# Patient Record
Sex: Female | Born: 1960 | Race: White | Hispanic: No | Smoking: Never smoker
Health system: Southern US, Community
[De-identification: ages and names within clinical notes are randomized; demographics above are authoritative.]

## PROBLEM LIST (undated history)

## (undated) DIAGNOSIS — H269 Unspecified cataract: Secondary | ICD-10-CM

## (undated) DIAGNOSIS — M719 Bursopathy, unspecified: Secondary | ICD-10-CM

## (undated) DIAGNOSIS — I10 Essential (primary) hypertension: Secondary | ICD-10-CM

## (undated) DIAGNOSIS — T7840XA Allergy, unspecified, initial encounter: Secondary | ICD-10-CM

## (undated) DIAGNOSIS — E7800 Pure hypercholesterolemia, unspecified: Secondary | ICD-10-CM

## (undated) HISTORY — PX: EXPLORATORY LAPAROTOMY: SUR591

## (undated) HISTORY — PX: EYE SURGERY: SHX253

## (undated) HISTORY — PX: OTHER SURGICAL HISTORY: SHX169

## (undated) HISTORY — PX: CARDIAC CATHETERIZATION: SHX172

## (undated) HISTORY — PX: ANAL FISTULOTOMY: SHX6423

## (undated) HISTORY — PX: CATARACT EXTRACTION: SUR2

## (undated) HISTORY — DX: Bursopathy, unspecified: M71.9

## (undated) HISTORY — DX: Unspecified cataract: H26.9

## (undated) HISTORY — DX: Allergy, unspecified, initial encounter: T78.40XA

---

## 2010-01-06 ENCOUNTER — Emergency Department (HOSPITAL_COMMUNITY): Admission: EM | Admit: 2010-01-06 | Discharge: 2010-01-06 | Payer: Self-pay | Admitting: Emergency Medicine

## 2010-03-17 ENCOUNTER — Encounter: Payer: Self-pay | Admitting: Emergency Medicine

## 2010-05-08 LAB — POCT URINALYSIS DIPSTICK
Glucose, UA: NEGATIVE mg/dL
Nitrite: NEGATIVE
Protein, ur: NEGATIVE mg/dL
Urobilinogen, UA: 0.2 mg/dL (ref 0.0–1.0)
pH: 7 (ref 5.0–8.0)

## 2011-11-29 ENCOUNTER — Emergency Department (HOSPITAL_COMMUNITY)
Admission: EM | Admit: 2011-11-29 | Discharge: 2011-11-29 | Disposition: A | Discharge status: Home / Self Care | Payer: BC Managed Care – PPO | Source: Home / Self Care | Attending: Emergency Medicine | Admitting: Emergency Medicine

## 2011-11-29 ENCOUNTER — Encounter (HOSPITAL_COMMUNITY): Payer: Self-pay | Admitting: Emergency Medicine

## 2011-11-29 DIAGNOSIS — H60399 Other infective otitis externa, unspecified ear: Secondary | ICD-10-CM

## 2011-11-29 DIAGNOSIS — H6092 Unspecified otitis externa, left ear: Secondary | ICD-10-CM

## 2011-11-29 HISTORY — DX: Essential (primary) hypertension: I10

## 2011-11-29 MED ORDER — ACETIC ACID 2 % OT SOLN: 4.0000 [drp] | Freq: Three times a day (TID) | OTIC | Status: AC

## 2011-11-29 MED ORDER — ACETAMINOPHEN-CODEINE #3 300-30 MG PO TABS: 1.0000 | ORAL_TABLET | Freq: Four times a day (QID) | ORAL | Status: DC | PRN

## 2011-11-29 NOTE — ED Provider Notes (Signed)
History     CSN: [id number]  Arrival date & time 11/29/11  1010   First MD Initiated Contact with Patient 11/29/11 1033      Chief Complaint  Patient presents with  . Otalgia    (Consider location/radiation/quality/duration/timing/severity/associated sxs/prior treatment) HPI Comments: Patient presents urgent care complaining of moderate to severe left ear pain and decreased hearing. (Patient wears hearing aids were both EARS), she describes that she sees any and he Dr. at [facility] where she resides. She describes that she gets frequent ear infections by Candida and she gets allergy shots for this purpose.  Patient describes that Wednesday she started (woke up), with severe pain and discomfort, or from her left ear. She describes that the night prior to say night she did feel some throbbing pain but was mild. Patient describes it Tuesday morning she saw her primary care doctor and got a flu shot and is wondering if this has something to do. She denies prior her ear hurting any sinus congestion, but does admit that she sneezes occasionally and has a somewhat mild stuffy nose. She does take loratadine for her allergies.  [facility] where she was told she had in the ear infection, was prescribed ofloxicin ear drops and azithromycin. She started with the ear drops as instructed but felt that she is no better and in fact this morning is worse she is tender and she is perceiving that she is hearing even less. She denies any fevers, headache nausea or vomiting.  The history is provided by the patient.    Past Medical History  Diagnosis Date  . Hypertension     Past Surgical History  Procedure Date  . Cardiac surgery     No family history on file.  History  Substance Use Topics  . Smoking status: Never Smoker   . Smokeless tobacco: Not on file  . Alcohol Use: No    OB History    Grav Para Term Preterm Abortions TAB SAB Ect Mult Living           Review of Systems  Constitutional: Negative for fever, chills, activity change and appetite change.  HENT: Positive for hearing loss, ear pain, congestion and sneezing. Negative for rhinorrhea, neck pain, neck stiffness, postnasal drip and tinnitus.   Skin: Negative for rash and wound.  Neurological: Negative for dizziness and headaches.    Allergies  Aspirin; Ceftin; Cortizone-10; Penicillins; Prednisone; and Sulfur  Home Medications   Current Outpatient Rx  Name Route Sig Dispense Refill  . DILTIAZEM HCL 120 MG PO TABS Oral Take 120 mg by mouth 4 (four) times daily.    . OMEGA-3 FATTY ACIDS 1000 MG PO CAPS Oral Take 2 g by mouth daily.    . FUROSEMIDE 10 MG/ML PO SOLN Oral Take by mouth daily.    Marland Kitchen LORATADINE 10 MG PO TABS Oral Take 10 mg by mouth daily.    Marland Kitchen PRAVASTATIN SODIUM 80 MG PO TABS Oral Take 80 mg by mouth daily.    . ACETAMINOPHEN-CODEINE #3 300-30 MG PO TABS Oral Take 1-2 tablets by mouth every 6 (six) hours as needed for pain. 15 tablet 0  . ACETIC ACID 2 % OT SOLN Left Ear Place 4 drops into the left ear 3 (three) times daily. 15 mL 0    BP 153/86  Pulse 92  Temp 98.6 F (37 C) (Oral)  Resp 20  SpO2 97%  Physical Exam  Nursing note and vitals reviewed.  Constitutional: Vital signs are normal. She appears well-developed and well-nourished.  Non-toxic appearance. She does not have a sickly appearance. She does not appear ill. No distress.  HENT:  Head: Normocephalic.  Right Ear: Tympanic membrane and ear canal normal.  Left Ear: There is drainage and tenderness. Decreased hearing is noted.  Ears:  Eyes: Conjunctivae normal are normal. Right eye exhibits no discharge. Left eye exhibits no discharge.  Neck: Neck supple. No JVD present.  Skin: No erythema.    ED Course  Irrigation Performed by: Shantese Raven Authorized by: Jimmie Molly Consent: Verbal consent obtained. Consent given by: patient Patient understanding: patient states understanding of  the procedure being performed Patient identity confirmed: verbally with patient Local anesthesia used: no Patient sedated: no Comments: Left ear irrigation- ear wick placement   (including critical care time)  Labs Reviewed - No data to display No results found.   1. Otitis externa of left ear       MDM  Postirradiation was able to visualize ear canal much better, patient with marked erythema and a exudate with marked tenderness with an ear traction and movement. Visualize the upper portion of her eardrum with no obvious retractions bulging or perforations. Have obtained a culture, have recommended patient to see her ENT Dr. early next week and have modified her previous treatment ascitic acid otic solution.        Jimmie Molly, MD 11/29/11 [phone]

## 2011-11-29 NOTE — ED Notes (Signed)
Pt c/o left ear pain x3 days... Sx include: chills, fevers, diarrhea.... Denies: vomiting, nausea, dizzy.... [facility] and given antibiotics

## 2011-12-02 LAB — EAR CULTURE

## 2011-12-11 ENCOUNTER — Telehealth (HOSPITAL_COMMUNITY): Payer: Self-pay | Admitting: *Deleted

## 2011-12-11 NOTE — ED Notes (Signed)
Ear culture left ear: Few MRSA.  Pt. had Azithromycin and Ofloxacin ear drops from her PCP.  Lab shown to Dr. Artis Flock 10/10 and he wrote to call to check on status.  If needed Doxycycline 100 mg. #20 1 BID.  I called pt. and she said she followed up with her ENT and it is cleared up now.  She said he changed her medication but does not remember what it was.  I told her to let her ENT know about the MRSA on the culture and she said she would.  Pt. given MRSA instructions. Suzanne Donaldson 12/11/2011

## 2011-12-16 ENCOUNTER — Telehealth (HOSPITAL_COMMUNITY): Payer: Self-pay | Admitting: *Deleted

## 2011-12-16 NOTE — ED Notes (Signed)
[facility] in [place] called and said the pt. had called them about having MRSA.  She asked what we had treated her with? I explained that her PCP already had her on Azithromycin and Ofloxacin ear drops.  We did not change anything at that time.  When the culture came back the doctor was going to change her to Doxycycline if not improved. I called the pt. and she said the infection was cleared up. The pt. said she had followed up at the ENT and they changed her antibiotic. She said they gave her some other meds but did not change the antibiotic. She asked what antibiotics was it sensitive and I read them to her. Vassie Moselle 12/16/2011

## 2016-04-13 ENCOUNTER — Ambulatory Visit (HOSPITAL_COMMUNITY)
Admission: EM | Admit: 2016-04-13 | Discharge: 2016-04-13 | Disposition: A | Discharge status: Home / Self Care | Payer: BC Managed Care – PPO | Attending: Family Medicine | Admitting: Family Medicine

## 2016-04-13 ENCOUNTER — Encounter (HOSPITAL_COMMUNITY): Payer: Self-pay | Admitting: *Deleted

## 2016-04-13 DIAGNOSIS — L02511 Cutaneous abscess of right hand: Secondary | ICD-10-CM

## 2016-04-13 MED ORDER — DOXYCYCLINE HYCLATE 100 MG PO CAPS: 100.0000 mg | ORAL_CAPSULE | Freq: Two times a day (BID) | ORAL | 0 refills | Status: DC

## 2016-04-13 NOTE — ED Provider Notes (Signed)
CSN: [id number]     Arrival date & time 04/13/16  1243 History   None    Chief Complaint  Patient presents with  . Hand Problem   (Consider location/radiation/quality/duration/timing/severity/associated sxs/prior Treatment) Patient thinks she was bitten by an ant on her right pinky and now it has sore and swelling middle knuckle.   The history is provided by the patient.  Abscess  Location:  Finger Finger abscess location:  R little finger Size:  0.5 cm Abscess quality: fluctuance, painful and redness   Pain details:    Severity:  No pain   Duration:  2 days   Timing:  Constant Chronicity:  New Relieved by:  Nothing   Past Medical History:  Diagnosis Date  . Hypertension    Past Surgical History:  Procedure Laterality Date  . CARDIAC SURGERY     History reviewed. No pertinent family history. Social History  Substance Use Topics  . Smoking status: Never Smoker  . Smokeless tobacco: Not on file  . Alcohol use No   OB History    No data available     Review of Systems  Constitutional: Negative.   HENT: Negative.   Eyes: Negative.   Respiratory: Negative.   Cardiovascular: Negative.   Endocrine: Negative.   Genitourinary: Negative.   Skin: Positive for wound.  Hematological: Negative.   Psychiatric/Behavioral: Negative.     Allergies  Aspirin; Ceftin [cefuroxime axetil]; Cortizone-10 [hydrocortisone]; Penicillins; Prednisone; and Sulfur  Home Medications   Prior to Admission medications   Medication Sig Start Date End Date Taking? Authorizing Provider  nebivolol (BYSTOLIC) 10 MG tablet Take 10 mg by mouth daily.   Yes Historical Provider, MD  acetaminophen-codeine (TYLENOL #3) 300-30 MG per tablet Take 1-2 tablets by mouth every 6 (six) hours as needed for pain. 11/29/11   Jimmie MollyPaolo Coll, MD  diltiazem (CARDIZEM) 120 MG tablet Take 120 mg by mouth 4 (four) times daily.    Historical Provider, MD  doxycycline (VIBRAMYCIN) 100 MG capsule Take 1 capsule (100 mg  total) by mouth 2 (two) times daily. 04/13/16   Deatra CanterWilliam J Xavion Muscat, FNP  fish oil-omega-3 fatty acids 1000 MG capsule Take 2 g by mouth daily.    Historical Provider, MD  furosemide (LASIX) 10 MG/ML solution Take by mouth daily.    Historical Provider, MD  loratadine (CLARITIN) 10 MG tablet Take 10 mg by mouth daily.    Historical Provider, MD  metaxalone (SKELAXIN) 800 MG tablet Take 800 mg by mouth 3 (three) times daily.    Historical Provider, MD  pravastatin (PRAVACHOL) 80 MG tablet Take 80 mg by mouth daily.    Historical Provider, MD   Meds Ordered and Administered this Visit  Medications - No data to display  BP 132/75 (BP Location: Right Arm)   Pulse 88   Temp 99.3 F (37.4 C) (Oral)   Resp 18   SpO2 100%  No data found.   Physical Exam  Constitutional: She appears well-developed and well-nourished.  HENT:  Head: Normocephalic.  Right Ear: External ear normal.  Left Ear: External ear normal.  Mouth/Throat: Oropharynx is clear and moist.  Eyes: Conjunctivae and EOM are normal. Pupils are equal, round, and reactive to light.  Neck: Normal range of motion. Neck supple.  Cardiovascular: Normal rate, regular rhythm and normal heart sounds.   Pulmonary/Chest: Effort normal and breath sounds normal.  Skin: Skin is warm and dry.  Cyst right pinky finger with erythema and purulence w/o drainage.  Positive fluctuance.  Nursing note and vitals reviewed.   Urgent Care Course     .Marland KitchenIncision and Drainage Date/Time: 04/13/2016 2:47 PM Performed by: Deatra Canter Authorized by: Bradd Canary D   Consent:    Consent obtained:  Verbal   Consent given by:  Patient   Risks discussed:  Bleeding and infection   Alternatives discussed:  No treatment Location:    Type:  Cyst   Size:  0.5cm   Location:  Upper extremity   Upper extremity location:  Finger   Finger location:  R small finger Pre-procedure details:    Skin preparation:  Betadine Anesthesia (see MAR for exact  dosages):    Anesthesia method:  None Procedure type:    Complexity:  Simple Procedure details:    Needle aspiration: yes     Needle size:  18 G   Incision types:  Stab incision   Incision depth:  Dermal   Scalpel blade:  11   Drainage:  Purulent and bloody   Drainage amount:  Scant   (including critical care time)  Labs Review Labs Reviewed - No data to display  Imaging Review No results found.   Visual Acuity Review  Right Eye Distance:   Left Eye Distance:   Bilateral Distance:    Right Eye Near:   Left Eye Near:    Bilateral Near:         MDM   1. Abscess of finger, right    Doxycycline 100mg  one po bid x 10 days #20 Incision and Drainage      Deatra Canter, FNP 04/13/16 1449

## 2016-04-13 NOTE — ED Triage Notes (Signed)
She   Was  Cleaning    2  Days  Ago   Poss  inj     Woke   Up   With  Pain   Pain  Redness   And   Swelling           Getting    Worse

## 2016-04-28 ENCOUNTER — Encounter (HOSPITAL_COMMUNITY): Payer: Self-pay | Admitting: Emergency Medicine

## 2016-04-28 ENCOUNTER — Ambulatory Visit (HOSPITAL_COMMUNITY)
Admission: EM | Admit: 2016-04-28 | Discharge: 2016-04-28 | Disposition: A | Discharge status: Home / Self Care | Payer: BC Managed Care – PPO | Attending: Family Medicine | Admitting: Family Medicine

## 2016-04-28 ENCOUNTER — Ambulatory Visit (INDEPENDENT_AMBULATORY_CARE_PROVIDER_SITE_OTHER): Payer: BC Managed Care – PPO

## 2016-04-28 DIAGNOSIS — S2241XA Multiple fractures of ribs, right side, initial encounter for closed fracture: Secondary | ICD-10-CM

## 2016-04-28 MED ORDER — HYDROCODONE-ACETAMINOPHEN 5-325 MG PO TABS: 1.0000 | ORAL_TABLET | Freq: Four times a day (QID) | ORAL | 0 refills | Status: DC | PRN

## 2016-04-28 NOTE — ED Provider Notes (Signed)
[facility]    CSN: [id number] Arrival date & time: 04/28/16  1501     History   Chief Complaint Chief Complaint  Patient presents with  . Fall    HPI Suzanne Donaldson is a 56 y.o. female.   This a 56 year old woman who was visiting her grandson at his elementary school when she fell 2 weeks ago landing on her right side. She did not experience pain at the time of the fall but the next day she was having fairly significant pain in the right mid thorax. The pain is gradually subsided somewhat over the past 2 weeks but she still has ongoing right anterolateral chest tenderness and pain when she takes a deep breath.  Patient's had no fever or shortness of breath      Past Medical History:  Diagnosis Date  . Hypertension     There are no active problems to display for this patient.   Past Surgical History:  Procedure Laterality Date  . CARDIAC SURGERY      OB History    No data available       Home Medications    Prior to Admission medications   Medication Sig Start Date End Date Taking? Authorizing Provider  diltiazem (CARDIZEM) 120 MG tablet Take 120 mg by mouth 4 (four) times daily.    Historical Provider, MD  fish oil-omega-3 fatty acids 1000 MG capsule Take 2 g by mouth daily.    Historical Provider, MD  furosemide (LASIX) 10 MG/ML solution Take by mouth daily.    Historical Provider, MD  HYDROcodone-acetaminophen (NORCO) 5-325 MG tablet Take 1 tablet by mouth every 6 (six) hours as needed for moderate pain. 04/28/16   Elvina Sidle, MD  loratadine (CLARITIN) 10 MG tablet Take 10 mg by mouth daily.    Historical Provider, MD  metaxalone (SKELAXIN) 800 MG tablet Take 800 mg by mouth 3 (three) times daily.    Historical Provider, MD  nebivolol (BYSTOLIC) 10 MG tablet Take 10 mg by mouth daily.    Historical Provider, MD  pravastatin (PRAVACHOL) 80 MG tablet Take 80 mg by mouth daily.    Historical Provider, MD    Family History History reviewed. No  pertinent family history.  Social History Social History  Substance Use Topics  . Smoking status: Never Smoker  . Smokeless tobacco: Not on file  . Alcohol use No     Allergies   Aspirin; Ceftin [cefuroxime axetil]; Cortizone-10 [hydrocortisone]; Penicillins; Prednisone; and Sulfur   Review of Systems Review of Systems  Constitutional: Negative.   HENT: Negative.   Respiratory: Negative for cough and shortness of breath.   Cardiovascular: Positive for chest pain.  Neurological: Negative.      Physical Exam Triage Vital Signs ED Triage Vitals [04/28/16 1620]  Enc Vitals Group     BP 140/63     Pulse Rate 63     Resp 18     Temp 98.4 F (36.9 C)     Temp Source Oral     SpO2 97 %     Weight      Height      Head Circumference      Peak Flow      Pain Score 5     Pain Loc      Pain Edu?      Excl. in GC?    No data found.   Updated Vital Signs BP 140/63 (BP Location: Left Arm)   Pulse 63  Temp 98.4 F (36.9 C) (Oral)   Resp 18   SpO2 97%    Physical Exam  Constitutional: She is oriented to person, place, and time. She appears well-developed and well-nourished.  HENT:  Head: Normocephalic.  Right Ear: External ear normal.  Left Ear: External ear normal.  Mouth/Throat: Oropharynx is clear and moist.  Eyes: Conjunctivae and EOM are normal. Pupils are equal, round, and reactive to light.  Neck: Normal range of motion. Neck supple.  Cardiovascular: Normal rate, regular rhythm and normal heart sounds.   Pulmonary/Chest: Effort normal and breath sounds normal.  Musculoskeletal: Normal range of motion.  Neurological: She is alert and oriented to person, place, and time.  Skin: Skin is warm and dry.  Nursing note and vitals reviewed.    UC Treatments / Results  Labs (all labs ordered are listed, but only abnormal results are displayed) Labs Reviewed - No data to display  EKG  EKG Interpretation None       Radiology Dg Ribs Unilateral  W/chest Right  Result Date: 04/28/2016 CLINICAL DATA:  Status post fall two weeks ago. Hit right side chest on floor. Right-sided chest pain, subacute onset. Initial encounter. EXAM: RIGHT RIBS AND CHEST - 3+ VIEW COMPARISON:  Chest radiograph from 01/06/2010 FINDINGS: There is suspicion of minimally displaced fractures of the right lateral fifth through seventh ribs. The lungs are well-aerated. Mild left basilar atelectasis is noted. There is no evidence of pleural effusion or pneumothorax. A tiny calcified granuloma is again noted at the right midlung zone. The cardiomediastinal silhouette is within normal limits. No acute osseous abnormalities are seen. IMPRESSION: 1. Suspect minimally displaced fractures of the right lateral fifth through seventh ribs. 2. Mild left basilar atelectasis noted. Electronically Signed   By: Roanna RaiderJeffery  Chang M.D.   On: 04/28/2016 17:06    Procedures Procedures (including critical care time)  Medications Ordered in UC Medications - No data to display   Initial Impression / Assessment and Plan / UC Course  I have reviewed the triage vital signs and the nursing notes.  Pertinent labs & imaging results that were available during my care of the patient were reviewed by me and considered in my medical decision making (see chart for details).     Final Clinical Impressions(s) / UC Diagnoses   Final diagnoses:  Closed fracture of multiple ribs of right side, initial encounter    New Prescriptions New Prescriptions   HYDROCODONE-ACETAMINOPHEN (NORCO) 5-325 MG TABLET    Take 1 tablet by mouth every 6 (six) hours as needed for moderate pain.     Elvina SidleKurt Gem Conkle, MD 04/28/16 1719

## 2016-04-28 NOTE — ED Triage Notes (Signed)
[facility] a [facility] pain to the right side of her chest secondary to a fall 2 weeks ago.

## 2016-09-08 ENCOUNTER — Ambulatory Visit (HOSPITAL_COMMUNITY)
Admission: EM | Admit: 2016-09-08 | Discharge: 2016-09-08 | Disposition: A | Discharge status: Home / Self Care | Payer: BC Managed Care – PPO | Attending: Internal Medicine | Admitting: Internal Medicine

## 2016-09-08 ENCOUNTER — Ambulatory Visit (INDEPENDENT_AMBULATORY_CARE_PROVIDER_SITE_OTHER): Payer: BC Managed Care – PPO

## 2016-09-08 ENCOUNTER — Encounter (HOSPITAL_COMMUNITY): Payer: Self-pay | Admitting: Emergency Medicine

## 2016-09-08 DIAGNOSIS — M79671 Pain in right foot: Secondary | ICD-10-CM | POA: Diagnosis not present

## 2016-09-08 DIAGNOSIS — S93491A Sprain of other ligament of right ankle, initial encounter: Secondary | ICD-10-CM

## 2016-09-08 MED ORDER — TRAMADOL HCL 50 MG PO TABS: 25.0000 mg | ORAL_TABLET | Freq: Two times a day (BID) | ORAL | 0 refills | Status: DC | PRN

## 2016-09-08 NOTE — ED Triage Notes (Signed)
Pt reports pain to the top of her right foot for one week.  She states she has been moving her home for the last week.  She states wearing a closed shoe gives it support and it will not hurt as much.  She is concerned for a stress fracture because she had one in the same foot seven years ago and it feels the same.

## 2016-09-08 NOTE — Discharge Instructions (Signed)
Please give the ankle about 3-4 days of solid rest.

## 2016-09-08 NOTE — ED Provider Notes (Signed)
CSN: [id number]     Arrival date & time 09/08/16  1356 History   None    Chief Complaint  Patient presents with  . Foot Pain    right   (Consider location/radiation/quality/duration/timing/severity/associated sxs/prior Treatment) HPI  Right foot pain that started a few weeks ago with moving.  Has tried tries tylenol with some relief of the pain.  Has a history of stress fracture in the same area.  Was treated with a walking boot and pain medication and this helped. Tells me that she did turn her ankle about 3 weeks ago and has not given it any rest.   Past Medical History:  Diagnosis Date  . Hypertension    Past Surgical History:  Procedure Laterality Date  . CARDIAC SURGERY     History reviewed. No pertinent family history. Social History  Substance Use Topics  . Smoking status: Never Smoker  . Smokeless tobacco: Never Used  . Alcohol use No   OB History    No data available     Review of Systems  Musculoskeletal: Negative for back pain and joint swelling.  Neurological: Negative for dizziness.    Allergies  Aspirin; Ceftin [cefuroxime axetil]; Cortizone-10 [hydrocortisone]; Penicillins; Prednisone; and Sulfur  Home Medications   Prior to Admission medications   Medication Sig Start Date End Date Taking? Authorizing Provider  diltiazem (CARDIZEM) 120 MG tablet Take 120 mg by mouth 4 (four) times daily.   Yes [provider]  furosemide (LASIX) 10 MG/ML solution Take by mouth daily.   Yes [provider]  loratadine (CLARITIN) 10 MG tablet Take 10 mg by mouth daily.   Yes [provider]  metaxalone (SKELAXIN) 800 MG tablet Take 800 mg by mouth 3 (three) times daily.   Yes [provider]  nebivolol (BYSTOLIC) 10 MG tablet Take 10 mg by mouth daily.   Yes [provider]  pravastatin (PRAVACHOL) 80 MG tablet Take 80 mg by mouth daily.   Yes [provider]  fish oil-omega-3 fatty acids 1000 MG capsule Take 2 g by  mouth daily.    [provider]  HYDROcodone-acetaminophen (NORCO) 5-325 MG tablet Take 1 tablet by mouth every 6 (six) hours as needed for moderate pain. 04/28/16   Elvina Sidle, MD   Meds Ordered and Administered this Visit  Medications - No data to display  BP 135/79 (BP Location: Left Arm)   Pulse 62   Temp 98.5 F (36.9 C) (Oral)   SpO2 95%  No data found.   Physical Exam  Constitutional: She appears well-developed and well-nourished.  Musculoskeletal: She exhibits tenderness. She exhibits no edema.       Right ankle: She exhibits normal range of motion, no swelling, no ecchymosis, no deformity and normal pulse. Tenderness. AITFL tenderness found. No lateral malleolus, no medial malleolus, no CF ligament, no posterior TFL and no head of 5th metatarsal tenderness found.  Skin: Skin is warm and dry.    Urgent Care Course     Procedures (including critical care time)  Labs Review Labs Reviewed - No data to display  Imaging Review Dg Foot Complete Right  Result Date: 09/08/2016 CLINICAL DATA:  Dorsal foot pain for 1 week after moving a lot of boxes, no known injury, history of RIGHT foot stress fracture EXAM: RIGHT FOOT COMPLETE - 3+ VIEW COMPARISON:  None FINDINGS: Mild osseous demineralization. Joint spaces preserved. Small plantar calcaneal spur. No acute fracture, dislocation or bone destruction. Mild dorsal soft tissue swelling at  midfoot. IMPRESSION: No acute osseous abnormalities. Electronically Signed   By: Ulyses SouthwardMark  Boles M.D.   On: 09/08/2016 15:19       MDM   1. Sprain of anterior talofibular ligament of right ankle, initial encounter    Sweedo and tramadol. Advised that she try to get some rest on the ankle.     Ofilia NeasClark, Michael L, PA-C 09/08/16 1535

## 2016-11-29 ENCOUNTER — Ambulatory Visit (HOSPITAL_COMMUNITY)
Admission: EM | Admit: 2016-11-29 | Discharge: 2016-11-29 | Disposition: A | Discharge status: Home / Self Care | Payer: BC Managed Care – PPO | Attending: Emergency Medicine | Admitting: Emergency Medicine

## 2016-11-29 ENCOUNTER — Encounter (HOSPITAL_COMMUNITY): Payer: Self-pay | Admitting: Family Medicine

## 2016-11-29 DIAGNOSIS — W57XXXA Bitten or stung by nonvenomous insect and other nonvenomous arthropods, initial encounter: Secondary | ICD-10-CM | POA: Diagnosis not present

## 2016-11-29 DIAGNOSIS — S90562A Insect bite (nonvenomous), left ankle, initial encounter: Secondary | ICD-10-CM

## 2016-11-29 MED ORDER — DOXYCYCLINE HYCLATE 100 MG PO CAPS: 100.0000 mg | ORAL_CAPSULE | Freq: Two times a day (BID) | ORAL | 0 refills | Status: DC

## 2016-11-29 NOTE — ED Provider Notes (Signed)
[facility]    CSN: [id number] Arrival date & time: 11/29/16  1752     History   Chief Complaint Chief Complaint  Patient presents with  . Insect Bite  . Allergic Reaction    HPI Suzanne Donaldson is a 56 y.o. female.   56 year old female states she was bitten by an aunt to the lateral aspect of the left ankle 2 days ago. She has a small papule approximately 3 mm across and there is minimal puffiness to the left side of the ankle and foot as well as minimal faint erythema. Patient is convinced that she has cellulitis and is strongly requesting antibiotics. She states she is allergic to and bites and breaks out all over with a rash however that did not occur this time. She denies systemic symptoms.patient has been applying topical Benadryl and oral Benadryl.      Past Medical History:  Diagnosis Date  . Hypertension     There are no active problems to display for this patient.   Past Surgical History:  Procedure Laterality Date  . CARDIAC SURGERY      OB History    No data available       Home Medications    Prior to Admission medications   Medication Sig Start Date End Date Taking? Authorizing Provider  diltiazem (CARDIZEM) 120 MG tablet Take 120 mg by mouth 4 (four) times daily.    [provider]  doxycycline (VIBRAMYCIN) 100 MG capsule Take 1 capsule (100 mg total) by mouth 2 (two) times daily. 11/29/16   Hayden Rasmussen, NP  fish oil-omega-3 fatty acids 1000 MG capsule Take 2 g by mouth daily.    [provider]  furosemide (LASIX) 10 MG/ML solution Take by mouth daily.    [provider]  HYDROcodone-acetaminophen (NORCO) 5-325 MG tablet Take 1 tablet by mouth every 6 (six) hours as needed for moderate pain. 04/28/16   Elvina Sidle, MD  loratadine (CLARITIN) 10 MG tablet Take 10 mg by mouth daily.    [provider]  metaxalone (SKELAXIN) 800 MG tablet Take 800 mg by mouth 3 (three) times daily.    [provider]  nebivolol (BYSTOLIC) 10 MG tablet Take 10 mg by mouth daily.    [provider]  pravastatin (PRAVACHOL) 80 MG tablet Take 80 mg by mouth daily.    [provider]  traMADol (ULTRAM) 50 MG tablet Take 0.5-1 tablets (25-50 mg total) by mouth every 12 (twelve) hours as needed for moderate pain or severe pain. 09/08/16   Ofilia Neas, PA-C    Family History History reviewed. No pertinent family history.  Social History Social History  Substance Use Topics  . Smoking status: Never Smoker  . Smokeless tobacco: Never Used  . Alcohol use No     Allergies   Aspirin; Ceftin [cefuroxime axetil]; Cortizone-10 [hydrocortisone]; Penicillins; Prednisone; and Sulfur   Review of Systems Review of Systems  Constitutional: Negative.   HENT: Negative.   Respiratory: Negative.   Gastrointestinal: Negative.   Skin:       As per history of present illness. No generalized rash  Neurological: Negative.   All other systems reviewed and are negative.    Physical Exam Triage Vital Signs ED Triage Vitals  Enc Vitals Group     BP 11/29/16 1820 (!) 166/93     Pulse Rate 11/29/16 1820 73     Resp 11/29/16 1820 18     Temp 11/29/16 1820 98.2 F (  36.8 C)     Temp src --      SpO2 11/29/16 1820 96 %     Weight --      Height --      Head Circumference --      Peak Flow --      Pain Score 11/29/16 1824 3     Pain Loc --      Pain Edu? --      Excl. in GC? --    No data found.   Updated Vital Signs BP (!) 166/93   Pulse 73   Temp 98.2 F (36.8 C)   Resp 18   SpO2 96%   Visual Acuity Right Eye Distance:   Left Eye Distance:   Bilateral Distance:    Right Eye Near:   Left Eye Near:    Bilateral Near:     Physical Exam  Constitutional: She is oriented to person, place, and time. She appears well-developed and well-nourished. No distress.  HENT:  Head: Normocephalic and atraumatic.  Voice is clear, no hoarseness.  Eyes: EOM are normal.    Neck: Normal range of motion. Neck supple.  Cardiovascular: Normal rate.   Pulmonary/Chest: Effort normal. No respiratory distress.  Respirations even and nonlabored.  Musculoskeletal:  As per history of present illness there is very minimal puffiness to the left ankle lateral aspect. Very minimal faint redness to the left lateral ankle and foot surrounding the ant bite. No lymphangitis.  Neurological: She is alert and oriented to person, place, and time. She exhibits normal muscle tone.  Skin: Skin is warm and dry.  Psychiatric: She has a normal mood and affect.  Nursing note and vitals reviewed.    UC Treatments / Results  Labs (all labs ordered are listed, but only abnormal results are displayed) Labs Reviewed - No data to display  EKG  EKG Interpretation None       Radiology No results found.  Procedures Procedures (including critical care time)  Medications Ordered in UC Medications - No data to display   Initial Impression / Assessment and Plan / UC Course  I have reviewed the triage vital signs and the nursing notes.  Pertinent labs & imaging results that were available during my care of the patient were reviewed by me and considered in my medical decision making (see chart for details). Although this does not appear to be definitive cellulitis to me the patient is insistent that she receive an antibiotic to prevent a cellulitis. This appears to be more of a local dermatological reaction to an insect bite at this time.   Medicines as directed. Apply ice over the area of redness and itching. May continue to apply Benadryl.    Final Clinical Impressions(s) / UC Diagnoses   Final diagnoses:  Insect bite, initial encounter    New Prescriptions New Prescriptions   DOXYCYCLINE (VIBRAMYCIN) 100 MG CAPSULE    Take 1 capsule (100 mg total) by mouth 2 (two) times daily.     Controlled Substance Prescriptions [place] Controlled Substance Registry consulted? Not  Applicable   Hayden Rasmussen, NP 11/29/16 1929

## 2016-11-29 NOTE — ED Triage Notes (Signed)
Pt here for allergic reaction left foot. sts that she was bit by an any on Wednesday. Redness and swelling.

## 2016-11-29 NOTE — Discharge Instructions (Signed)
Medicines as directed. Apply ice over the area of redness and itching. May continue to apply Benadryl.

## 2017-02-19 ENCOUNTER — Other Ambulatory Visit: Payer: Self-pay

## 2017-02-19 ENCOUNTER — Ambulatory Visit (HOSPITAL_COMMUNITY)
Admission: EM | Admit: 2017-02-19 | Discharge: 2017-02-19 | Disposition: A | Discharge status: Home / Self Care | Payer: BC Managed Care – PPO | Attending: Family Medicine | Admitting: Family Medicine

## 2017-02-19 ENCOUNTER — Encounter (HOSPITAL_COMMUNITY): Payer: Self-pay | Admitting: Emergency Medicine

## 2017-02-19 DIAGNOSIS — J0110 Acute frontal sinusitis, unspecified: Secondary | ICD-10-CM

## 2017-02-19 DIAGNOSIS — R0789 Other chest pain: Secondary | ICD-10-CM

## 2017-02-19 MED ORDER — MOXIFLOXACIN HCL 400 MG PO TABS: 400.0000 mg | ORAL_TABLET | Freq: Every day | ORAL | 0 refills | Status: DC

## 2017-02-19 NOTE — ED Triage Notes (Signed)
Pt reports sinus congestion for over one week.  Yesterday she started developing some chest pain with coughing and bending over.

## 2017-02-19 NOTE — ED Provider Notes (Signed)
  [facility]   [id number] 02/19/17 Arrival Time: 1153  ASSESSMENT & PLAN:  1. Acute non-recurrent frontal sinusitis   2. Chest wall pain     Meds ordered this encounter  Medications  . moxifloxacin (AVELOX) 400 MG tablet    Sig: Take 1 tablet (400 mg total) by mouth daily at 8 pm.    Dispense:  10 tablet    Refill:  0    OTC symptom care as needed. Tylenol for chest wall pain. Will f/u with PCP or here as needed.  Reviewed expectations re: course of current medical issues. Questions answered. Outlined signs and symptoms indicating need for more acute intervention. Patient verbalized understanding. After Visit Summary given.   SUBJECTIVE: History from: patient.  Suzanne Donaldson is a 56 y.o. female who presents with complaint of nasal congestion, post-nasal drainage, and sinus pressure (worsening over the past few days). Onset abrupt, approximately 2 weeks ago. H/O sinusitis; followed by ENT. SOB: none. Wheezing: none. Fever: no. Overall normal PO intake without n/v. Sick contacts: no. OTC treatment: None. Social History   Tobacco Use  Smoking Status Never Smoker  Smokeless Tobacco Never Used   Also complains of L-sided chest wall pain. Noticed after dog pulled leash hard a few days ago. Also laughing hard last evening with mild exacerbation. No n/v.  ROS: As per HPI.   OBJECTIVE:  Vitals:   02/19/17 1239  BP: (!) 159/100  Pulse: (!) 58  Temp: 98.4 F (36.9 C)  TempSrc: Oral  SpO2: 98%     General appearance: alert; no distress HEENT: nasal congestion; clear runny nose; throat irritation secondary to post-nasal drainage; frontal sinus pressure Neck: supple without LAD Lungs: clear to auscultation bilaterally; no cough Skin: warm and dry Psychological: alert and cooperative; normal mood and affect   Allergies  Allergen Reactions  . Aspirin   . Ceftin [Cefuroxime Axetil]   . Cortizone-10 [Hydrocortisone]   . Penicillins   . Prednisone   .  Sulfur     Past Medical History:  Diagnosis Date  . Hypertension    History reviewed. No pertinent family history. Social History   Socioeconomic History  . Marital status: Widowed    Spouse name: Not on file  . Number of children: Not on file  . Years of education: Not on file  . Highest education level: Not on file  Social Needs  . Financial resource strain: Not on file  . Food insecurity - worry: Not on file  . Food insecurity - inability: Not on file  . Transportation needs - medical: Not on file  . Transportation needs - non-medical: Not on file  Occupational History  . Not on file  Tobacco Use  . Smoking status: Never Smoker  . Smokeless tobacco: Never Used  Substance and Sexual Activity  . Alcohol use: No  . Drug use: No  . Sexual activity: Not on file  Other Topics Concern  . Not on file  Social History Narrative  . Not on file           Mardella LaymanHagler, Dayja Loveridge, MD 02/19/17 1304

## 2017-05-03 ENCOUNTER — Ambulatory Visit (HOSPITAL_COMMUNITY)
Admission: EM | Admit: 2017-05-03 | Discharge: 2017-05-03 | Disposition: A | Discharge status: Home / Self Care | Payer: BC Managed Care – PPO | Attending: Family Medicine | Admitting: Family Medicine

## 2017-05-03 ENCOUNTER — Encounter (HOSPITAL_COMMUNITY): Payer: Self-pay | Admitting: *Deleted

## 2017-05-03 ENCOUNTER — Other Ambulatory Visit: Payer: Self-pay

## 2017-05-03 DIAGNOSIS — B9789 Other viral agents as the cause of diseases classified elsewhere: Secondary | ICD-10-CM

## 2017-05-03 DIAGNOSIS — J069 Acute upper respiratory infection, unspecified: Secondary | ICD-10-CM

## 2017-05-03 HISTORY — DX: Pure hypercholesterolemia, unspecified: E78.00

## 2017-05-03 MED ORDER — CROMOLYN SODIUM 5.2 MG/ACT NA AERS: 1.0000 | INHALATION_SPRAY | Freq: Four times a day (QID) | NASAL | 12 refills | Status: DC

## 2017-05-03 MED ORDER — DOXYCYCLINE HYCLATE 100 MG PO CAPS: 100.0000 mg | ORAL_CAPSULE | Freq: Two times a day (BID) | ORAL | 0 refills | Status: AC

## 2017-05-03 NOTE — ED Triage Notes (Signed)
C/O runny nose, congestion, sneezing, body aches, fevers up to 102, bilat earache, clear eye drainage x 3 days.  [facility] - tested neg for flu but given Tamiflu (has been taking).  States feels she is getting worse.  Has been taking Tyl.

## 2017-05-03 NOTE — Discharge Instructions (Signed)
Push fluids to ensure adequate hydration and keep secretions thin.  Tylenol and/or ibuprofen as needed for pain or fevers.  Complete course of tamiflu as prescribed.  May start antibiotics 3/13 if worsening or no improvement of symptoms. Rest.  Nasal spray to help with congestion. I would continue with mucinex to help loosen secretions. If symptoms worsen or do not improve in the next week to return to be seen or to follow up with your PCP.

## 2017-05-03 NOTE — ED Provider Notes (Signed)
[facility]    CSN: [id number] Arrival date & time: 05/03/17  1200     History   Chief Complaint Chief Complaint  Patient presents with  . Nasal Congestion  . Fever    HPI Suzanne Donaldson is a 57 y.o. female.   Elvin SoWindy presents with complaints of sneezing, coughing which is occasionally productive, sinus drainage and congestion, headache, bilateral ear pain, occasional nausea, chills and fevers, tmax of 102 which started 3/6. Has not checked her temperature since then. Chills last night. No known fever today. Eating and drinking, denies gi/gu complaints. No known ill contacts. [facility] and was started on tamiflu for influenza like illness, did swab negative at that time. States she has a history of sinus infections and follows with ENT. Taking tylenol, last at 0930 which has helped with temperatures. Has taken mucinex as well which minimally helped. History of htn and hypercholesteremia.    ROS per HPI.        Past Medical History:  Diagnosis Date  . Hypercholesteremia   . Hypertension     There are no active problems to display for this patient.   Past Surgical History:  Procedure Laterality Date  . ANAL FISTULOTOMY    . CARDIAC CATHETERIZATION    . CATARACT EXTRACTION    . EXPLORATORY LAPAROTOMY      OB History    No data available       Home Medications    Prior to Admission medications   Medication Sig Start Date End Date Taking? Authorizing Provider  diltiazem (CARDIZEM) 120 MG tablet Take 120 mg by mouth 4 (four) times daily.   Yes [provider]  furosemide (LASIX) 10 MG/ML solution Take by mouth daily.   Yes [provider]  loratadine (CLARITIN) 10 MG tablet Take 10 mg by mouth daily.   Yes [provider]  Lorcaserin HCl (BELVIQ PO) Take by mouth.   Yes [provider]  nebivolol (BYSTOLIC) 10 MG tablet Take 10 mg by mouth daily.   Yes [provider]  Oseltamivir  Phosphate (TAMIFLU PO) Take by mouth.   Yes [provider]  pravastatin (PRAVACHOL) 80 MG tablet Take 80 mg by mouth daily.   Yes [provider]  cromolyn (NASALCROM) 5.2 MG/ACT nasal spray Place 1 spray into both nostrils 4 (four) times daily. 05/03/17   Georgetta HaberBurky, Eddy Liszewski B, NP  doxycycline (VIBRAMYCIN) 100 MG capsule Take 1 capsule (100 mg total) by mouth 2 (two) times daily for 10 days. 05/07/17 05/17/17  Georgetta HaberBurky, Leeman Johnsey B, NP    Family History Family History  Problem Relation Age of Onset  . Heart disease Mother   . Heart disease Father     Social History Social History   Tobacco Use  . Smoking status: Never Smoker  . Smokeless tobacco: Never Used  Substance Use Topics  . Alcohol use: No  . Drug use: No     Allergies   Aspirin; Biaxin [clarithromycin]; Ceftin [cefuroxime axetil]; Cortizone-10 [hydrocortisone]; Penicillins; Prednisone; and Sulfur   Review of Systems Review of Systems   Physical Exam Triage Vital Signs ED Triage Vitals  Enc Vitals Group     BP 05/03/17 1211 (!) 151/77     Pulse Rate 05/03/17 1211 62     Resp 05/03/17 1211 20     Temp 05/03/17 1211 98.5 F (36.9 C)     Temp Source 05/03/17 1211 Oral     SpO2 05/03/17 1211 97 %  Weight --      Height --      Head Circumference --      Peak Flow --      Pain Score 05/03/17 1213 5     Pain Loc --      Pain Edu? --      Excl. in GC? --    No data found.  Updated Vital Signs BP (!) 151/77   Pulse 62   Temp 98.5 F (36.9 C) (Oral)   Resp 20   SpO2 97%   Visual Acuity Right Eye Distance:   Left Eye Distance:   Bilateral Distance:    Right Eye Near:   Left Eye Near:    Bilateral Near:     Physical Exam  Constitutional: She is oriented to person, place, and time. She appears well-developed and well-nourished. No distress.  HENT:  Head: Normocephalic and atraumatic.  Right Ear: Tympanic membrane, external ear and ear canal normal.  Left Ear: Tympanic membrane,  external ear and ear canal normal.  Nose: Rhinorrhea present. Right sinus exhibits maxillary sinus tenderness. Left sinus exhibits maxillary sinus tenderness.  Mouth/Throat: Uvula is midline, oropharynx is clear and moist and mucous membranes are normal. No tonsillar exudate.  Eyes: Conjunctivae and EOM are normal. Pupils are equal, round, and reactive to light.  Cardiovascular: Normal rate, regular rhythm and normal heart sounds.  Pulmonary/Chest: Effort normal and breath sounds normal.  Without cough throughout exam  Neurological: She is alert and oriented to person, place, and time.  Skin: Skin is warm and dry.     UC Treatments / Results  Labs (all labs ordered are listed, but only abnormal results are displayed) Labs Reviewed - No data to display  EKG  EKG Interpretation None       Radiology No results found.  Procedures Procedures (including critical care time)  Medications Ordered in UC Medications - No data to display   Initial Impression / Assessment and Plan / UC Course  I have reviewed the triage vital signs and the nursing notes.  Pertinent labs & imaging results that were available during my care of the patient were reviewed by me and considered in my medical decision making (see chart for details).     Benign physical findings. Non toxic in appearance. History and physical consistent with viral illness.  Supportive cares recommended. To complete course of tamiflu. Patient upset and states she is tired of being ill and would like antibiotics. Education of viral illness discussed, symptoms for 3-4 days only at this time. Doxy sent to pharmacy to be picked up 3/13 if worsening or persistent symptoms. Return precautions provided. Patient verbalized understanding and agreeable to plan.    Final Clinical Impressions(s) / UC Diagnoses   Final diagnoses:  Viral URI with cough    ED Discharge Orders        Ordered    doxycycline (VIBRAMYCIN) 100 MG capsule  2  times daily     05/03/17 1230    cromolyn (NASALCROM) 5.2 MG/ACT nasal spray  4 times daily     05/03/17 1230       Controlled Substance Prescriptions [place] Controlled Substance Registry consulted? Not Applicable   Georgetta Haber, NP 05/03/17 1237

## 2017-05-16 ENCOUNTER — Encounter: Payer: Self-pay | Admitting: Cardiology

## 2017-05-27 ENCOUNTER — Other Ambulatory Visit: Payer: Self-pay

## 2017-05-27 ENCOUNTER — Ambulatory Visit (HOSPITAL_COMMUNITY)
Admission: EM | Admit: 2017-05-27 | Discharge: 2017-05-27 | Disposition: A | Discharge status: Home / Self Care | Payer: BC Managed Care – PPO | Attending: Family Medicine | Admitting: Family Medicine

## 2017-05-27 ENCOUNTER — Ambulatory Visit: Payer: BC Managed Care – PPO | Admitting: Cardiology

## 2017-05-27 ENCOUNTER — Encounter: Payer: Self-pay | Admitting: *Deleted

## 2017-05-27 ENCOUNTER — Encounter: Payer: Self-pay | Admitting: Cardiology

## 2017-05-27 ENCOUNTER — Encounter (HOSPITAL_COMMUNITY): Payer: Self-pay | Admitting: Emergency Medicine

## 2017-05-27 VITALS — BP 130/82 | HR 63 | Ht 64.0 in | Wt 193.4 lb

## 2017-05-27 DIAGNOSIS — I2510 Atherosclerotic heart disease of native coronary artery without angina pectoris: Secondary | ICD-10-CM | POA: Diagnosis not present

## 2017-05-27 DIAGNOSIS — Z79899 Other long term (current) drug therapy: Secondary | ICD-10-CM

## 2017-05-27 DIAGNOSIS — E7800 Pure hypercholesterolemia, unspecified: Secondary | ICD-10-CM

## 2017-05-27 DIAGNOSIS — M7552 Bursitis of left shoulder: Secondary | ICD-10-CM

## 2017-05-27 DIAGNOSIS — I10 Essential (primary) hypertension: Secondary | ICD-10-CM

## 2017-05-27 MED ORDER — ATORVASTATIN CALCIUM 80 MG PO TABS: 80.0000 mg | ORAL_TABLET | Freq: Every day | ORAL | 11 refills | Status: DC

## 2017-05-27 MED ORDER — BUPIVACAINE HCL (PF) 0.5 % IJ SOLN
INTRAMUSCULAR | Status: AC
  Filled 2017-05-27: qty 10

## 2017-05-27 MED ORDER — METHYLPREDNISOLONE ACETATE 80 MG/ML IJ SUSP
INTRAMUSCULAR | Status: AC
  Filled 2017-05-27: qty 1

## 2017-05-27 MED ORDER — METHYLPREDNISOLONE ACETATE 80 MG/ML IJ SUSP
80.0000 mg | Freq: Once | INTRAMUSCULAR | Status: AC
  Administered 2017-05-27: 80 mg via INTRA_ARTICULAR

## 2017-05-27 NOTE — Patient Instructions (Addendum)
Medication Instructions:  Please discontinue your Pravastatin start Atorvastatin 80 mg a day.  Continue all other medications as listed.  Labwork: Please have fasting lab work 2 months after starting Atorvastatin 80 mg a day. (Lipid/ALT)  Testing/Procedures: Your physician has requested that you have an echocardiogram. Echocardiography is a painless test that uses sound waves to create images of your heart. It provides your doctor with information about the size and shape of your heart and how well your heart's chambers and valves are working. This procedure takes approximately one hour. There are no restrictions for this procedure.  Your physician has requested that you have a myoview. For further information please visit [email or web address]. Please follow instruction sheet, as given.  Follow-Up: Follow up in 1 year with Dr. Anne FuSkains.  You will receive a letter in the mail 2 months before you are due.  Please call us when you receive this letter to schedule your follow up appointment.  If you need a refill on your cardiac medications before your next appointment, please call your pharmacy.  Thank you for choosing [facility]!!

## 2017-05-27 NOTE — ED Provider Notes (Addendum)
[facility]   [id number] 05/27/17 Arrival Time: 1001   SUBJECTIVE:  Suzanne Donaldson is a 56 y.o. female who presents to the urgent care with complaint of left shoulder pain.  She states her PCP gives her cortisone injections when it starts burning like this but she is not near her PCP at this time.  Patient is from [facility], babysitting her 5 grandchildren.  Past Medical History:  Diagnosis Date  . Bursitis   . Hypercholesteremia   . Hypertension    Family History  Problem Relation Age of Onset  . Heart disease Mother   . Hypertension Mother   . Heart disease Father   . Hypertension Father   . Diabetes Father   . Heart disease Sister   . Hypertension Sister   . Diabetes Sister   . Heart attack Maternal Grandfather   . Colon cancer Neg Hx   . Breast cancer Neg Hx   . Mental illness Neg Hx    Social History   Socioeconomic History  . Marital status: Widowed    Spouse name: Not on file  . Number of children: Not on file  . Years of education: Not on file  . Highest education level: Not on file  Occupational History  . Not on file  Social Needs  . Financial resource strain: Not on file  . Food insecurity:    Worry: Not on file    Inability: Not on file  . Transportation needs:    Medical: Not on file    Non-medical: Not on file  Tobacco Use  . Smoking status: Never Smoker  . Smokeless tobacco: Never Used  Substance and Sexual Activity  . Alcohol use: No  . Drug use: No  . Sexual activity: Not on file  Lifestyle  . Physical activity:    Days per week: Not on file    Minutes per session: Not on file  . Stress: Not on file  Relationships  . Social connections:    Talks on phone: Not on file    Gets together: Not on file    Attends religious service: Not on file    Active member of club or organization: Not on file    Attends meetings of clubs or organizations: Not on file    Relationship status: Not on file  . Intimate partner violence:      Fear of current or ex partner: Not on file    Emotionally abused: Not on file    Physically abused: Not on file    Forced sexual activity: Not on file  Other Topics Concern  . Not on file  Social History Narrative  . Not on file   Current Meds  Medication Sig  . BELVIQ 10 MG TABS Take 1 tablet by mouth daily.  Marland Kitchen diltiazem (CARDIZEM) 120 MG tablet Take 120 mg by mouth daily.  Marland Kitchen loratadine (CLARITIN) 10 MG tablet Take 10 mg by mouth daily.  . nebivolol (BYSTOLIC) 10 MG tablet Take 10 mg by mouth daily.  . Vitamin D, Ergocalciferol, (DRISDOL) 50000 units CAPS capsule Take 1 capsule by mouth once a week.   Allergies  Allergen Reactions  . Aspirin   . Biaxin [Clarithromycin]     Interferes with Cardizem  . Ceftin [Cefuroxime Axetil]   . Cortizone-10 [Hydrocortisone]   . Penicillins   . Prednisone   . Sulfur       ROS: As per HPI, remainder of ROS negative.   OBJECTIVE:   Vitals:  05/27/17 1049  BP: 138/74  Pulse: 60  Temp: 98.3 F (36.8 C)  TempSrc: Oral  SpO2: 97%     General appearance: alert; no distress Eyes: PERRL; EOMI; conjunctiva normal HENT: normocephalic; atraumatic; Neck: supple Back: no CVA tenderness Extremities: no cyanosis or edema; symmetrical with no gross deformities;  Difficulty with abduction left shoulder Skin: warm and dry; 3 cm left upper triceps red plaque where she had allergy shot Neurologic: normal gait; grossly normal Psychological: alert and cooperative; normal mood and affect      Labs:  Results for orders placed or performed during the hospital encounter of 11/29/11  Ear culture  Result Value Ref Range   Specimen Description EAR LEFT    Special Requests LEFT EAR CANAL     Culture      FEW METHICILLIN RESISTANT STAPHYLOCOCCUS AUREUS Note: RIFAMPIN AND GENTAMICIN SHOULD NOT BE USED AS SINGLE DRUGS FOR TREATMENT OF STAPH INFECTIONS. MODERATE DIPHTHEROIDS(CORYNEBACTERIUM SPECIES) Note: Standardized susceptibility  testing for this organism is not available.   Report Status 12/02/2011 FINAL    Organism ID, Bacteria METHICILLIN RESISTANT STAPHYLOCOCCUS AUREUS       Susceptibility   Methicillin resistant staphylococcus aureus - MIC*    CLINDAMYCIN >=8 Resistant     ERYTHROMYCIN >=8 Resistant     GENTAMICIN <=0.5 Sensitive     LEVOFLOXACIN 4 Intermediate     OXACILLIN >=4 Resistant     PENICILLIN >=0.5 Resistant     RIFAMPIN <=0.5 Sensitive     TRIMETH/SULFA <=10 Sensitive     VANCOMYCIN 1 Sensitive     TETRACYCLINE <=1 Sensitive     * FEW METHICILLIN RESISTANT STAPHYLOCOCCUS AUREUS    Labs Reviewed - No data to display  No results found.     ASSESSMENT & PLAN:  1. Acute bursitis of left shoulder     Meds ordered this encounter  Medications  . methylPREDNISolone acetate (DEPO-MEDROL) injection 80 mg    Reviewed expectations re: course of current medical issues. Questions answered. Outlined signs and symptoms indicating need for more acute intervention. Patient verbalized understanding. After Visit Summary given.    Procedures:  Left posterior shoulder joint line injected with 0.5% Marcaine (1 cc) and Depo-Medrol 80 mg/cc (1cc) without complication.      Elvina SidleLauenstein, Layonna Dobie, MD 05/27/17 1129    Elvina SidleLauenstein, Hades Mathew, MD 05/27/17 1130

## 2017-05-27 NOTE — ED Triage Notes (Signed)
Pt reports left shoulder pain.  She states her PCP gives her cortisone injections when it starts burning like this but she is not near her PCP at this time.

## 2017-05-27 NOTE — Progress Notes (Signed)
Cardiology Office Note:    Date:  05/27/2017   ID:  Suzanne Donaldson, DOB [birth year], MRN [id number]  PCP:  Britt Bottom, MD  Cardiologist:  No primary care provider on file.   Referring MD: Britt Bottom, *    History of Present Illness:    Suzanne Donaldson is a 57 y.o. female with coronary artery disease, hypertension, hyperlipidemia here for evaluation of coronary artery disease at the request of Clarita Leber.  During Christmas time in 2018 she had chest discomfort which she attributed to laughing and joking but then she started to have difficulty catching her breath when walking up hills.  She went to an urgent care and was diagnosed with a strain muscle.  She occasionally would have chest pain.  Intermittently when climbing stairs this seems to develop. CP has improved but dyspnea with any climbing of activity. Gym. Treadmill. Harder. Tired.   She has a prior history of cardiac catheterization about 2009 without stenting (had 80% blockage in 3 coming together and treat medically) Last stress test was in 2015, nuclear and normal.  Echocardiogram at that time also showed normal LV function EF 55% with mild mitral regurgitation..  She has been diagnosed with coronary atherosclerosis.  She moved to [facility] so she wanted to establish with [facility].  Previously saw a cardiologist with [facility].  Her father has heart disease her mother has heart disease (MI late 8's) her sister has heart disease, pacer/.  Left foot pain on top, red and sore. Hard at gym.  Said Crestor drove her crazy. Barely move in the morning.   Non smoker.   In September 2018, LDL was 168, HDL 56, triglycerides 223    Past Medical History:  Diagnosis Date  . Bursitis   . Hypercholesteremia   . Hypertension     Past Surgical History:  Procedure Laterality Date  . ANAL FISTULOTOMY    . CARDIAC CATHETERIZATION    . CATARACT EXTRACTION    . EXPLORATORY LAPAROTOMY      Current  Medications: Current Meds  Medication Sig  . BELVIQ 10 MG TABS Take 1 tablet by mouth daily.  Marland Kitchen diltiazem (CARDIZEM) 120 MG tablet Take 120 mg by mouth daily.  Marland Kitchen loratadine (CLARITIN) 10 MG tablet Take 10 mg by mouth daily.  . nebivolol (BYSTOLIC) 10 MG tablet Take 10 mg by mouth daily.  . nitroGLYCERIN (NITROSTAT) 0.4 MG SL tablet Place 0.4 mg under the tongue every 5 (five) minutes as needed for chest pain.  . Vitamin D, Ergocalciferol, (DRISDOL) 50000 units CAPS capsule Take 1 capsule by mouth once a week.  . [DISCONTINUED] pravastatin (PRAVACHOL) 80 MG tablet Take 80 mg by mouth daily.     Allergies:   Aspirin; Biaxin [clarithromycin]; Ceftin [cefuroxime axetil]; Cortizone-10 [hydrocortisone]; Penicillins; Prednisone; and Sulfur   Social History   Socioeconomic History  . Marital status: Widowed    Spouse name: Not on file  . Number of children: Not on file  . Years of education: Not on file  . Highest education level: Not on file  Occupational History  . Not on file  Social Needs  . Financial resource strain: Not on file  . Food insecurity:    Worry: Not on file    Inability: Not on file  . Transportation needs:    Medical: Not on file    Non-medical: Not on file  Tobacco Use  . Smoking status: Never Smoker  . Smokeless tobacco: Never Used  Substance and Sexual  Activity  . Alcohol use: No  . Drug use: No  . Sexual activity: Not on file  Lifestyle  . Physical activity:    Days per week: Not on file    Minutes per session: Not on file  . Stress: Not on file  Relationships  . Social connections:    Talks on phone: Not on file    Gets together: Not on file    Attends religious service: Not on file    Active member of club or organization: Not on file    Attends meetings of clubs or organizations: Not on file    Relationship status: Not on file  Other Topics Concern  . Not on file  Social History Narrative  . Not on file     Family History: The patient's  family history includes Diabetes in her father and sister; Heart attack in her maternal grandfather; Heart disease in her father, mother, and sister; Hypertension in her father, mother, and sister. There is no history of Colon cancer, Breast cancer, or Mental illness.  ROS:   Please see the history of present illness.     All other systems reviewed and are negative.  EKGs/Labs/Other Studies Reviewed:    The following studies were reviewed today:  Echocardiogram 2015-EF 60%, mild mitral regurgitation Nuclear stress test 2015-no ischemia, no perfusion defects   EKG:  EKG is ordered today.  The ekg ordered today demonstrates 05/27/17 shows sinus rhythm 63 with no significant abnormalities other than nonspecific ST-T wave changes.  Recent Labs: No results found for requested labs within last 8760 hours.  Recent Lipid Panel No results found for: CHOL, TRIG, HDL, CHOLHDL, VLDL, LDLCALC, LDLDIRECT  Physical Exam:    VS:  BP 130/82   Pulse 63   Ht 5\' 4"  (1.626 m)   Wt 193 lb 6.4 oz (87.7 kg)   BMI 33.20 kg/m     Wt Readings from Last 3 Encounters:  05/27/17 193 lb 6.4 oz (87.7 kg)     GEN:  Well nourished, well developed in no acute distress HEENT: Normal NECK: No JVD; No carotid bruits LYMPHATICS: No lymphadenopathy CARDIAC: RRR, no murmurs, rubs, gallops RESPIRATORY:  Clear to auscultation without rales, wheezing or rhonchi  ABDOMEN: Soft, non-tender, non-distended MUSCULOSKELETAL:  No edema; No deformity  SKIN: Warm and dry NEUROLOGIC:  Alert and oriented x 3 PSYCHIATRIC:  Normal affect   ASSESSMENT:    1. Coronary artery disease involving native coronary artery of native heart without angina pectoris   2. Essential hypertension   3. Pure hypercholesterolemia   4. Long-term use of high-risk medication    PLAN:    In order of problems listed above:  Angina with  coronary artery disease -She states that she had a cardiac catheter[place] in [place] about 10 years ago  and was told that she had an 80% lesion that was going to be treated medically.  She remembers something about 3 vessels coming together.  Perhaps this was a branch vessel. -Last stress test in 2015 showed no evidence of ischemia.  I think it makes sense to repeat stress test to ensure that there is been no changes.  Essential hypertension -Continue to monitor.  Medications reviewed  Mixed hyperlipidemia - Dietary, exercise modifications.  She just refilled her pravastatin 80 mg and has 2 months left.  I would like to change her over to a high intensity statin, atorvastatin 80 mg once a day in 2 months.  She states that she took Crestor  in the past and has some trouble getting out of bed in the morning previously many years ago.  Recheck lipid panel 2 months after starting atorvastatin with ALT.  One year follow-up or sooner if necessary.  Medication Adjustments/Labs and Tests Ordered: Current medicines are reviewed at length with the patient today.  Concerns regarding medicines are outlined above.  Orders Placed This Encounter  Procedures  . ALT  . Lipid panel  . Myocardial Perfusion Imaging  . EKG 12-Lead  . ECHOCARDIOGRAM COMPLETE   Meds ordered this encounter  Medications  . atorvastatin (LIPITOR) 80 MG tablet    Sig: Take 1 tablet (80 mg total) by mouth daily.    Dispense:  30 tablet    Refill:  11    Signed, Donato Schultz, MD  05/27/2017 9:27 AM    [facility]

## 2017-06-02 ENCOUNTER — Telehealth (HOSPITAL_COMMUNITY): Payer: Self-pay | Admitting: *Deleted

## 2017-06-02 NOTE — Telephone Encounter (Signed)
Patient given detailed instructions per Myocardial Perfusion Study Information Sheet for the test on 06/04/17. Patient notified to arrive 15 minutes early and that it is imperative to arrive on time for appointment to keep from having the test rescheduled.  If you need to cancel or reschedule your appointment, please call the office within 24 hours of your appointment. . Patient verbalized understanding. Melodye Swor Jacqueline    

## 2017-06-04 ENCOUNTER — Ambulatory Visit (HOSPITAL_BASED_OUTPATIENT_CLINIC_OR_DEPARTMENT_OTHER): Payer: BC Managed Care – PPO

## 2017-06-04 ENCOUNTER — Other Ambulatory Visit: Payer: Self-pay

## 2017-06-04 ENCOUNTER — Ambulatory Visit (HOSPITAL_COMMUNITY): Payer: BC Managed Care – PPO | Attending: Cardiology

## 2017-06-04 DIAGNOSIS — I119 Hypertensive heart disease without heart failure: Secondary | ICD-10-CM | POA: Diagnosis not present

## 2017-06-04 DIAGNOSIS — I10 Essential (primary) hypertension: Secondary | ICD-10-CM

## 2017-06-04 DIAGNOSIS — I2510 Atherosclerotic heart disease of native coronary artery without angina pectoris: Secondary | ICD-10-CM | POA: Insufficient documentation

## 2017-06-04 DIAGNOSIS — R079 Chest pain, unspecified: Secondary | ICD-10-CM | POA: Diagnosis not present

## 2017-06-04 DIAGNOSIS — I340 Nonrheumatic mitral (valve) insufficiency: Secondary | ICD-10-CM | POA: Diagnosis not present

## 2017-06-04 DIAGNOSIS — R0609 Other forms of dyspnea: Secondary | ICD-10-CM | POA: Insufficient documentation

## 2017-06-04 DIAGNOSIS — E785 Hyperlipidemia, unspecified: Secondary | ICD-10-CM | POA: Insufficient documentation

## 2017-06-04 LAB — MYOCARDIAL PERFUSION IMAGING
CHL CUP NUCLEAR SDS: 3
CHL CUP NUCLEAR SRS: 2
CHL RATE OF PERCEIVED EXERTION: 20
CSEPHR: 71 %
CSEPPHR: 116 {beats}/min
Estimated workload: 10.1 METS
Exercise duration (min): 8 min
LVDIAVOL: 93 mL (ref 46–106)
LVSYSVOL: 35 mL
MPHR: 163 {beats}/min
RATE: 0.3
Rest HR: 56 {beats}/min
SSS: 5
TID: 0.95

## 2017-06-04 MED ORDER — TECHNETIUM TC 99M TETROFOSMIN IV KIT
32.7000 | PACK | Freq: Once | INTRAVENOUS | Status: AC | PRN
  Administered 2017-06-04: 32.7 via INTRAVENOUS
  Filled 2017-06-04: qty 33

## 2017-06-04 MED ORDER — TECHNETIUM TC 99M TETROFOSMIN IV KIT
10.8000 | PACK | Freq: Once | INTRAVENOUS | Status: AC | PRN
  Administered 2017-06-04: 10.8 via INTRAVENOUS
  Filled 2017-06-04: qty 11

## 2017-06-04 MED ORDER — REGADENOSON 0.4 MG/5ML IV SOLN
0.4000 mg | Freq: Once | INTRAVENOUS | Status: AC
  Administered 2017-06-04: 0.4 mg via INTRAVENOUS

## 2017-06-05 ENCOUNTER — Telehealth: Payer: Self-pay | Admitting: Cardiology

## 2017-06-05 NOTE — Telephone Encounter (Signed)
Reviewed results of testing with patient who states understanding. 

## 2017-06-05 NOTE — Telephone Encounter (Signed)
New Message    Patient calling back to get her echo results

## 2017-07-30 ENCOUNTER — Other Ambulatory Visit: Payer: BC Managed Care – PPO | Admitting: *Deleted

## 2017-07-30 DIAGNOSIS — Z79899 Other long term (current) drug therapy: Secondary | ICD-10-CM

## 2017-07-30 DIAGNOSIS — E7800 Pure hypercholesterolemia, unspecified: Secondary | ICD-10-CM

## 2017-07-30 LAB — LIPID PANEL
CHOL/HDL RATIO: 2.4 ratio (ref 0.0–4.4)
Cholesterol, Total: 133 mg/dL (ref 100–199)
HDL: 55 mg/dL (ref >39)
LDL Calculated: 61 mg/dL (ref 0–99)
TRIGLYCERIDES: 85 mg/dL (ref 0–149)
VLDL Cholesterol Cal: 17 mg/dL (ref 5–40)

## 2017-07-30 LAB — ALT: ALT: 20 IU/L (ref 0–32)

## 2017-09-28 ENCOUNTER — Encounter (HOSPITAL_COMMUNITY): Payer: Self-pay | Admitting: Emergency Medicine

## 2017-09-28 ENCOUNTER — Ambulatory Visit (HOSPITAL_COMMUNITY)
Admission: EM | Admit: 2017-09-28 | Discharge: 2017-09-28 | Disposition: A | Discharge status: Home / Self Care | Payer: BC Managed Care – PPO | Attending: Family Medicine | Admitting: Family Medicine

## 2017-09-28 ENCOUNTER — Other Ambulatory Visit: Payer: Self-pay

## 2017-09-28 DIAGNOSIS — Z9889 Other specified postprocedural states: Secondary | ICD-10-CM | POA: Diagnosis not present

## 2017-09-28 DIAGNOSIS — R519 Headache, unspecified: Secondary | ICD-10-CM

## 2017-09-28 DIAGNOSIS — Z7983 Long term (current) use of bisphosphonates: Secondary | ICD-10-CM | POA: Insufficient documentation

## 2017-09-28 DIAGNOSIS — I10 Essential (primary) hypertension: Secondary | ICD-10-CM | POA: Diagnosis not present

## 2017-09-28 DIAGNOSIS — Z888 Allergy status to other drugs, medicaments and biological substances status: Secondary | ICD-10-CM | POA: Insufficient documentation

## 2017-09-28 DIAGNOSIS — E7800 Pure hypercholesterolemia, unspecified: Secondary | ICD-10-CM | POA: Diagnosis not present

## 2017-09-28 DIAGNOSIS — Z8249 Family history of ischemic heart disease and other diseases of the circulatory system: Secondary | ICD-10-CM | POA: Insufficient documentation

## 2017-09-28 DIAGNOSIS — Z833 Family history of diabetes mellitus: Secondary | ICD-10-CM | POA: Diagnosis not present

## 2017-09-28 DIAGNOSIS — R51 Headache: Secondary | ICD-10-CM | POA: Insufficient documentation

## 2017-09-28 DIAGNOSIS — R350 Frequency of micturition: Secondary | ICD-10-CM | POA: Diagnosis present

## 2017-09-28 DIAGNOSIS — Z79899 Other long term (current) drug therapy: Secondary | ICD-10-CM | POA: Insufficient documentation

## 2017-09-28 DIAGNOSIS — Z886 Allergy status to analgesic agent status: Secondary | ICD-10-CM | POA: Diagnosis not present

## 2017-09-28 DIAGNOSIS — Z881 Allergy status to other antibiotic agents status: Secondary | ICD-10-CM | POA: Insufficient documentation

## 2017-09-28 DIAGNOSIS — R300 Dysuria: Secondary | ICD-10-CM | POA: Diagnosis not present

## 2017-09-28 DIAGNOSIS — Z880 Allergy status to penicillin: Secondary | ICD-10-CM | POA: Insufficient documentation

## 2017-09-28 DIAGNOSIS — R3915 Urgency of urination: Secondary | ICD-10-CM | POA: Diagnosis present

## 2017-09-28 LAB — POCT URINALYSIS DIP (DEVICE)
Bilirubin Urine: NEGATIVE
Glucose, UA: NEGATIVE mg/dL
HGB URINE DIPSTICK: NEGATIVE
KETONES UR: NEGATIVE mg/dL
Nitrite: NEGATIVE
PH: 5.5 (ref 5.0–8.0)
PROTEIN: NEGATIVE mg/dL
Specific Gravity, Urine: 1.01 (ref 1.005–1.030)
Urobilinogen, UA: 0.2 mg/dL (ref 0.0–1.0)

## 2017-09-28 MED ORDER — NITROFURANTOIN MONOHYD MACRO 100 MG PO CAPS: 100.0000 mg | ORAL_CAPSULE | Freq: Two times a day (BID) | ORAL | 0 refills | Status: DC

## 2017-09-28 MED ORDER — PHENAZOPYRIDINE HCL 200 MG PO TABS: 200.0000 mg | ORAL_TABLET | Freq: Three times a day (TID) | ORAL | 0 refills | Status: DC

## 2017-09-28 NOTE — Discharge Instructions (Addendum)
Urine culture sent.  We will call you with the results.   Push fluids and get plenty of rest.   Take antibiotic as directed and to completion Take pyridium as prescribed and as needed for symptomatic relief Follow up with PCP if symptoms persists Return here or go to ER if you have any new or worsening symptoms such as fever, worsening abdominal pain, nausea/vomiting, flank pain, etc...  Continue with allergies medications  Recommend OTC flonase.  Use as needed for symptomatic relief Follow up with PCP if symptoms persists Return or go to the ER if you have any new or worsening symptoms

## 2017-09-28 NOTE — ED Provider Notes (Signed)
[facility]   SUBJECTIVE:  Suzanne Donaldson is a 57 y.o. female who complains of polyuria, urinary urgency, dysuria and dark urine x 4 days.  Patient denies a precipitating event, recent sexual encounter, excessive caffeine intake.  Localizes the pain right flank.  Pain is intermittent and describes it as sharp and dull.  Has tried OTC medications like tylenol with temporary relief.  Denies aggravating factors.  Admits to similar symptoms in the past.  Complains of nausea.   Denies fever, chills, vomiting, abdominal pain, abnormal vaginal discharge or bleeding, hematuria.     Patient also complains of sinus headache x 2 days.  She localizes her pain to the left side of forehead.  She describes the pain as constant and dull in character.  She has tried OTC tylenol with relief.  Denies aggravating factors.  She reports similar symptoms in the past that improved with antibiotics.  She does not describe this as the worst headache of her life.  Complains of associated rhinorrhea and watery eyes.  She denies aura, chest pain, SOB,  weakness, numbness or tingling.    LMP: No LMP recorded. Patient is postmenopausal.  ROS: As in HPI.  Past Medical History:  Diagnosis Date  . Bursitis   . Hypercholesteremia   . Hypertension    Past Surgical History:  Procedure Laterality Date  . ANAL FISTULOTOMY    . CARDIAC CATHETERIZATION    . CATARACT EXTRACTION    . EXPLORATORY LAPAROTOMY     Allergies  Allergen Reactions  . Aspirin   . Biaxin [Clarithromycin]     Interferes with Cardizem  . Ceftin [Cefuroxime Axetil]   . Cortizone-10 [Hydrocortisone]   . Penicillins   . Prednisone   . Sulfur    No current facility-administered medications on file prior to encounter.    Current Outpatient Medications on File Prior to Encounter  Medication Sig Dispense Refill  . atorvastatin (LIPITOR) 80 MG tablet Take 1 tablet (80 mg total) by mouth daily. 30 tablet 11  . BELVIQ 10 MG TABS Take 1 tablet  by mouth daily.    Marland Kitchen diltiazem (CARDIZEM) 120 MG tablet Take 120 mg by mouth daily.    Marland Kitchen loratadine (CLARITIN) 10 MG tablet Take 10 mg by mouth daily.    . nebivolol (BYSTOLIC) 10 MG tablet Take 10 mg by mouth daily.    . nitroGLYCERIN (NITROSTAT) 0.4 MG SL tablet Place 0.4 mg under the tongue every 5 (five) minutes as needed for chest pain.    . Vitamin D, Ergocalciferol, (DRISDOL) 50000 units CAPS capsule Take 1 capsule by mouth once a week.     Social History   Socioeconomic History  . Marital status: Widowed    Spouse name: Not on file  . Number of children: Not on file  . Years of education: Not on file  . Highest education level: Not on file  Occupational History  . Not on file  Social Needs  . Financial resource strain: Not on file  . Food insecurity:    Worry: Not on file    Inability: Not on file  . Transportation needs:    Medical: Not on file    Non-medical: Not on file  Tobacco Use  . Smoking status: Never Smoker  . Smokeless tobacco: Never Used  Substance and Sexual Activity  . Alcohol use: No  . Drug use: No  . Sexual activity: Not on file  Lifestyle  . Physical activity:    Days per week: Not  on file    Minutes per session: Not on file  . Stress: Not on file  Relationships  . Social connections:    Talks on phone: Not on file    Gets together: Not on file    Attends religious service: Not on file    Active member of club or organization: Not on file    Attends meetings of clubs or organizations: Not on file    Relationship status: Not on file  . Intimate partner violence:    Fear of current or ex partner: Not on file    Emotionally abused: Not on file    Physically abused: Not on file    Forced sexual activity: Not on file  Other Topics Concern  . Not on file  Social History Narrative  . Not on file   Family History  Problem Relation Age of Onset  . Heart disease Mother   . Hypertension Mother   . Heart disease Father   . Hypertension Father    . Diabetes Father   . Heart disease Sister   . Hypertension Sister   . Diabetes Sister   . Heart attack Maternal Grandfather   . Colon cancer Neg Hx   . Breast cancer Neg Hx   . Mental illness Neg Hx     OBJECTIVE:  Vitals:   09/28/17 1017  BP: (!) 143/69  Pulse: 60  Resp: 16  Temp: 98.3 F (36.8 C)  TempSrc: Oral  SpO2: 96%   General appearance: AOx3 in no acute distress HEENT: NCAT Ears: EACs clear, TMs pearly gray; Eyes: PERRL.  EOM grossly intact.  Left side frontal sinus tenderness; Nose: mild clear rhinorrhea; tonsils nonerythematous, uvula midline Lungs: clear to auscultation bilaterally without adventitious breath sounds Heart: regular rate and rhythm.  Radial pulses 2+ symmetrical bilaterally Abdomen: soft; non-distended; non-tender to light and deep palpation; bowel sounds present; no guarding Back: no CVA tenderness Extremities: no edema; symmetrical with no gross deformities Skin: warm and dry Neurologic: Ambulates from chair to exam table without difficulty Psychological: alert and cooperative; normal mood and affect  Labs Reviewed  POCT URINALYSIS DIP (DEVICE) - Abnormal; Notable for the following components:      Result Value   Leukocytes, UA TRACE (*)    All other components within normal limits  URINE CULTURE    ASSESSMENT & PLAN:  1. Dysuria   2. Urinary frequency   3. Sinus headache     Meds ordered this encounter  Medications  . nitrofurantoin, macrocrystal-monohydrate, (MACROBID) 100 MG capsule    Sig: Take 1 capsule (100 mg total) by mouth 2 (two) times daily.    Dispense:  10 capsule    Refill:  0    Order Specific Question:   Supervising Provider    Answer:   Isa Rankin [phone]  . phenazopyridine (PYRIDIUM) 200 MG tablet    Sig: Take 1 tablet (200 mg total) by mouth 3 (three) times daily.    Dispense:  6 tablet    Refill:  0    Order Specific Question:   Supervising Provider    Answer:   Isa Rankin [[id number]]    Urine culture sent.  We will call you with the results.   Push fluids and get plenty of rest.   Take antibiotic as directed and to completion Take pyridium as prescribed and as needed for symptomatic relief Follow up with PCP if symptoms persists Return here or go to ER if you have any  new or worsening symptoms such as fever, worsening abdominal pain, nausea/vomiting, flank pain, etc...  Continue with allergies medications  Recommend OTC flonase.  Use as needed for symptomatic relief  Follow up with PCP if symptoms persists Return or go to the ER if you have any new or worsening symptoms   Outlined signs and symptoms indicating need for more acute intervention. Patient verbalized understanding. After Visit Summary given.     Rennis HardingWurst, Mahoganie Basher, PA-C 09/28/17 1049

## 2017-09-28 NOTE — ED Triage Notes (Signed)
[facility] with a complaint of dysuria and dark colored urine x 4 days. The patient also complained of a headache with watering eyes x 3 days that she believed to be allergy related.

## 2017-09-29 LAB — URINE CULTURE: Culture: NO GROWTH

## 2017-11-30 ENCOUNTER — Encounter (HOSPITAL_COMMUNITY): Payer: Self-pay | Admitting: Emergency Medicine

## 2017-11-30 ENCOUNTER — Ambulatory Visit (HOSPITAL_COMMUNITY)
Admission: EM | Admit: 2017-11-30 | Discharge: 2017-11-30 | Disposition: A | Discharge status: Home / Self Care | Payer: BC Managed Care – PPO | Attending: Internal Medicine | Admitting: Internal Medicine

## 2017-11-30 DIAGNOSIS — J069 Acute upper respiratory infection, unspecified: Secondary | ICD-10-CM

## 2017-11-30 MED ORDER — FLUTICASONE PROPIONATE 50 MCG/ACT NA SUSP: 1.0000 | Freq: Every day | NASAL | 2 refills | Status: DC

## 2017-11-30 MED ORDER — CROMOLYN SODIUM 5.2 MG/ACT NA AERS: 1.0000 | INHALATION_SPRAY | Freq: Four times a day (QID) | NASAL | 12 refills | Status: DC

## 2017-11-30 MED ORDER — BENZONATATE 100 MG PO CAPS: 100.0000 mg | ORAL_CAPSULE | Freq: Three times a day (TID) | ORAL | 0 refills | Status: DC

## 2017-11-30 NOTE — ED Provider Notes (Signed)
[facility]    CSN: [id number] Arrival date & time: 11/30/17  1412     History   Chief Complaint Chief Complaint  Patient presents with  . URI    HPI Suzanne Donaldson is a 57 y.o. female.   [name] presents with complaints of sneezing, ear pressure, chills, congestion and nasal drainage, scratchy throat, slight cough which is non productive, which started two days ago. Chills yesterday but no specific fevers. No gi/gu complaints. Has not taken any medications for symptoms but does take daily claritin. States was around someone at an event a few days ago who ended up with illness. No chest pain or shortness of breath. Hx of htn, bursitis.     ROS per HPI.      Past Medical History:  Diagnosis Date  . Bursitis   . Hypercholesteremia   . Hypertension     There are no active problems to display for this patient.   Past Surgical History:  Procedure Laterality Date  . ANAL FISTULOTOMY    . CARDIAC CATHETERIZATION    . CATARACT EXTRACTION    . EXPLORATORY LAPAROTOMY      OB History   None      Home Medications    Prior to Admission medications   Medication Sig Start Date End Date Taking? Authorizing Provider  atorvastatin (LIPITOR) 80 MG tablet Take 1 tablet (80 mg total) by mouth daily. 05/27/17   Jake Bathe, MD  BELVIQ 10 MG TABS Take 1 tablet by mouth daily. 04/18/17   [provider]  benzonatate (TESSALON) 100 MG capsule Take 1 capsule (100 mg total) by mouth every 8 (eight) hours. 11/30/17   Georgetta Haber, NP  cromolyn (NASALCROM) 5.2 MG/ACT nasal spray Place 1 spray into both nostrils 4 (four) times daily. 11/30/17   Georgetta Haber, NP  diltiazem (CARDIZEM) 120 MG tablet Take 120 mg by mouth daily.    [provider]  fluticasone (FLONASE) 50 MCG/ACT nasal spray Place 1 spray into both nostrils daily. 11/30/17   Georgetta Haber, NP  loratadine (CLARITIN) 10 MG tablet Take 10 mg by mouth daily.    [provider]    nebivolol (BYSTOLIC) 10 MG tablet Take 10 mg by mouth daily.    [provider]  nitroGLYCERIN (NITROSTAT) 0.4 MG SL tablet Place 0.4 mg under the tongue every 5 (five) minutes as needed for chest pain.    [provider]  Vitamin D, Ergocalciferol, (DRISDOL) 50000 units CAPS capsule Take 1 capsule by mouth once a week. 05/07/17   [provider]    Family History Family History  Problem Relation Age of Onset  . Heart disease Mother   . Hypertension Mother   . Heart disease Father   . Hypertension Father   . Diabetes Father   . Heart disease Sister   . Hypertension Sister   . Diabetes Sister   . Heart attack Maternal Grandfather   . Colon cancer Neg Hx   . Breast cancer Neg Hx   . Mental illness Neg Hx     Social History Social History   Tobacco Use  . Smoking status: Never Smoker  . Smokeless tobacco: Never Used  Substance Use Topics  . Alcohol use: No  . Drug use: No     Allergies   Aspirin; Biaxin [clarithromycin]; Ceftin [cefuroxime axetil]; Cortizone-10 [hydrocortisone]; Penicillins; Prednisone; and Sulfur   Review of Systems Review of Systems   Physical Exam Triage Vital Signs  ED Triage Vitals  Enc Vitals Group     BP 11/30/17 1433 138/68     Pulse Rate 11/30/17 1433 61     Resp 11/30/17 1433 18     Temp 11/30/17 1433 98.1 F (36.7 C)     Temp Source 11/30/17 1433 Oral     SpO2 11/30/17 1433 97 %     Weight --      Height --      Head Circumference --      Peak Flow --      Pain Score 11/30/17 1434 5     Pain Loc --      Pain Edu? --      Excl. in GC? --    No data found.  Updated Vital Signs BP 138/68 (BP Location: Left Arm)   Pulse 61   Temp 98.1 F (36.7 C) (Oral)   Resp 18   SpO2 97%   Physical Exam  Constitutional: She is oriented to person, place, and time. She appears well-developed and well-nourished. No distress.  HENT:  Head: Normocephalic and atraumatic.  Right Ear: Tympanic membrane, external ear  and ear canal normal.  Left Ear: Tympanic membrane, external ear and ear canal normal.  Nose: Rhinorrhea present. Right sinus exhibits no maxillary sinus tenderness and no frontal sinus tenderness. Left sinus exhibits no maxillary sinus tenderness and no frontal sinus tenderness.  Mouth/Throat: Uvula is midline, oropharynx is clear and moist and mucous membranes are normal. No tonsillar exudate.  Eyes: Pupils are equal, round, and reactive to light. Conjunctivae and EOM are normal.  Cardiovascular: Normal rate, regular rhythm and normal heart sounds.  Pulmonary/Chest: Effort normal and breath sounds normal.  Lymphadenopathy:    She has cervical adenopathy.  Neurological: She is alert and oriented to person, place, and time.  Skin: Skin is warm and dry.     UC Treatments / Results  Labs (all labs ordered are listed, but only abnormal results are displayed) Labs Reviewed - No data to display  EKG None  Radiology No results found.  Procedures Procedures (including critical care time)  Medications Ordered in UC Medications - No data to display  Initial Impression / Assessment and Plan / UC Course  I have reviewed the triage vital signs and the nursing notes.  Pertinent labs & imaging results that were available during my care of the patient were reviewed by me and considered in my medical decision making (see chart for details).     Non toxic. Afebrile. Benign physical exam. Viral vs allergic in nature. Supportive cares recommended. Patient states she feels she needs antibiotics, discussed lack of indications for this today. Return precautions provided. If symptoms worsen or do not improve in the next week to return to be seen or to follow up with PCP.  Patient verbalized understanding and agreeable to plan.   Final Clinical Impressions(s) / UC Diagnoses   Final diagnoses:  Upper respiratory tract infection, unspecified type     Discharge Instructions     Push fluids to  ensure adequate hydration and keep secretions thin.  Tylenol and/or ibuprofen as needed for pain or fevers.  Use of daily flonase as well as cromolyn nasal spray up to 4 times a day to help with sinus symptoms.  Tessalon as needed for cough.  Throat lozenges, gargles, chloraseptic spray, warm teas, popsicles etc to help with throat pain.   If symptoms worsen or do not improve in the next 7-10 days to return to be  seen or to follow up with your PCP.     ED Prescriptions    Medication Sig Dispense Auth. Provider   fluticasone (FLONASE) 50 MCG/ACT nasal spray Place 1 spray into both nostrils daily. 16 g Linus Mako B, NP   cromolyn (NASALCROM) 5.2 MG/ACT nasal spray Place 1 spray into both nostrils 4 (four) times daily. 26 mL Linus Mako B, NP   benzonatate (TESSALON) 100 MG capsule Take 1 capsule (100 mg total) by mouth every 8 (eight) hours. 21 capsule Georgetta Haber, NP     Controlled Substance Prescriptions [place] Controlled Substance Registry consulted? Not Applicable   Georgetta Haber, NP 11/30/17 1536

## 2017-11-30 NOTE — ED Triage Notes (Signed)
Pt here for URI sx and congestion 

## 2017-11-30 NOTE — Discharge Instructions (Signed)
Push fluids to ensure adequate hydration and keep secretions thin.  Tylenol and/or ibuprofen as needed for pain or fevers.  Use of daily flonase as well as cromolyn nasal spray up to 4 times a day to help with sinus symptoms.  Tessalon as needed for cough.  Throat lozenges, gargles, chloraseptic spray, warm teas, popsicles etc to help with throat pain.   If symptoms worsen or do not improve in the next 7-10 days to return to be seen or to follow up with your PCP.

## 2017-12-10 ENCOUNTER — Encounter: Payer: Self-pay | Admitting: Gastroenterology

## 2017-12-15 ENCOUNTER — Other Ambulatory Visit: Payer: Self-pay

## 2017-12-15 ENCOUNTER — Ambulatory Visit (AMBULATORY_SURGERY_CENTER): Payer: Self-pay | Admitting: *Deleted

## 2017-12-15 VITALS — Ht 62.0 in | Wt 186.4 lb

## 2017-12-15 DIAGNOSIS — Z1211 Encounter for screening for malignant neoplasm of colon: Secondary | ICD-10-CM

## 2017-12-15 MED ORDER — SUPREP BOWEL PREP KIT 17.5-3.13-1.6 GM/177ML PO SOLN: 1.0000 | Freq: Once | ORAL | 0 refills | Status: AC

## 2017-12-15 NOTE — Progress Notes (Signed)
Patient denies any allergies to egg or soy products. Patient denies complications with anesthesia/sedation.  Patient denies oxygen use at home and patient stopped Belviq on 12/01/17. Pamphlet given on colonoscopy.

## 2017-12-26 ENCOUNTER — Encounter: Payer: Self-pay | Admitting: Gastroenterology

## 2018-01-09 ENCOUNTER — Encounter: Payer: BC Managed Care – PPO | Admitting: Gastroenterology

## 2018-01-16 ENCOUNTER — Encounter: Payer: Self-pay | Admitting: Gastroenterology

## 2018-01-16 ENCOUNTER — Ambulatory Visit (AMBULATORY_SURGERY_CENTER): Payer: BC Managed Care – PPO | Admitting: Gastroenterology

## 2018-01-16 VITALS — BP 112/74 | HR 52 | Temp 98.2°F | Resp 19 | Ht 62.0 in | Wt 186.0 lb

## 2018-01-16 DIAGNOSIS — Z1211 Encounter for screening for malignant neoplasm of colon: Secondary | ICD-10-CM | POA: Diagnosis not present

## 2018-01-16 MED ORDER — SODIUM CHLORIDE 0.9 % IV SOLN: 500.0000 mL | Freq: Once | INTRAVENOUS | Status: DC

## 2018-01-16 NOTE — Op Note (Signed)
[facility] Patient Name: Suzanne Donaldson Procedure Date: 01/16/2018 8:12 AM MRN: [id number] Endoscopist: Sherilyn Cooter L. Myrtie Neither , MD Age: 57 Referring MD:  Date of Birth: Apr 19, 1960 Gender: Female Account #: [id number] Procedure:                Colonoscopy Indications:              Screening for colorectal malignant neoplasm, This                            is the patient's first colonoscopy Medicines:                Monitored Anesthesia Care Procedure:                Pre-Anesthesia Assessment:                           - Prior to the procedure, a History and Physical                            was performed, and patient medications and                            allergies were reviewed. The patient's tolerance of                            previous anesthesia was also reviewed. The risks                            and benefits of the procedure and the sedation                            options and risks were discussed with the patient.                            All questions were answered, and informed consent                            was obtained. Prior Anticoagulants: The patient has                            taken no previous anticoagulant or antiplatelet                            agents. ASA Grade Assessment: II - A patient with                            mild systemic disease. After reviewing the risks                            and benefits, the patient was deemed in                            satisfactory condition to undergo the procedure.  After obtaining informed consent, the colonoscope                            was passed under direct vision. Throughout the                            procedure, the patient's blood pressure, pulse, and                            oxygen saturations were monitored continuously. The                            Colonoscope was introduced through the anus and                            advanced to the the  cecum, identified by                            appendiceal orifice and ileocecal valve. The                            colonoscopy was performed without difficulty. The                            patient tolerated the procedure well. The quality                            of the bowel preparation was excellent. The bowel                            preparation used was SUPREP. Scope In: 8:16:16 AM Scope Out: 8:27:12 AM Scope Withdrawal Time: 0 hours 7 minutes 32 seconds  Total Procedure Duration: 0 hours 10 minutes 56 seconds  Findings:                 The perianal and digital rectal examinations were                            normal.                           The entire examined colon appeared normal on direct                            and retroflexion views. Complications:            No immediate complications. Estimated Blood Loss:     Estimated blood loss: none. Impression:               - The entire examined colon is normal on direct and                            retroflexion views.                           - No specimens collected. Recommendation:           -  Patient has a contact number available for                            emergencies. The signs and symptoms of potential                            delayed complications were discussed with the                            patient. Return to normal activities tomorrow.                            Written discharge instructions were provided to the                            patient.                           - Resume previous diet.                           - Continue present medications.                           - Repeat colonoscopy in 10 years for screening                            purposes. Jayvion Stefanski L. Myrtie Neither, MD 01/16/2018 8:30:34 AM This report has been signed electronically.

## 2018-01-16 NOTE — Patient Instructions (Signed)
Impression/Recommendations:  Resume previous diet. Continue present medications.  Repeat colonoscopy in 10 years for screening purposes.  YOU HAD AN ENDOSCOPIC PROCEDURE TODAY AT THE [facility]:   Refer to the procedure report that was given to you for any specific questions about what was found during the examination.  If the procedure report does not answer your questions, please call your gastroenterologist to clarify.  If you requested that your care partner not be given the details of your procedure findings, then the procedure report has been included in a sealed envelope for you to review at your convenience later.  YOU SHOULD EXPECT: Some feelings of bloating in the abdomen. Passage of more gas than usual.  Walking can help get rid of the air that was put into your GI tract during the procedure and reduce the bloating. If you had a lower endoscopy (such as a colonoscopy or flexible sigmoidoscopy) you may notice spotting of blood in your stool or on the toilet paper. If you underwent a bowel prep for your procedure, you may not have a normal bowel movement for a few days.  Please Note:  You might notice some irritation and congestion in your nose or some drainage.  This is from the oxygen used during your procedure.  There is no need for concern and it should clear up in a day or so.  SYMPTOMS TO REPORT IMMEDIATELY:   Following lower endoscopy (colonoscopy or flexible sigmoidoscopy):  Excessive amounts of blood in the stool  Significant tenderness or worsening of abdominal pains  Swelling of the abdomen that is new, acute  Fever of 100F or higher  For urgent or emergent issues, a gastroenterologist can be reached at any hour by calling [phone].   DIET:  We do recommend a small meal at first, but then you may proceed to your regular diet.  Drink plenty of fluids but you should avoid alcoholic beverages for 24 hours.  ACTIVITY:  You should plan to take it easy  for the rest of today and you should NOT DRIVE or use heavy machinery until tomorrow (because of the sedation medicines used during the test).    FOLLOW UP: Our staff will call the number listed on your records the next business day following your procedure to check on you and address any questions or concerns that you may have regarding the information given to you following your procedure. If we do not reach you, we will leave a message.  However, if you are feeling well and you are not experiencing any problems, there is no need to return our call.  We will assume that you have returned to your regular daily activities without incident.  If any biopsies were taken you will be contacted by phone or by letter within the next 1-3 weeks.  Please call us at [phone] if you have not heard about the biopsies in 3 weeks.    SIGNATURES/CONFIDENTIALITY: You and/or your care partner have signed paperwork which will be entered into your electronic medical record.  These signatures attest to the fact that that the information above on your After Visit Summary has been reviewed and is understood.  Full responsibility of the confidentiality of this discharge information lies with you and/or your care-partner. 

## 2018-01-16 NOTE — Progress Notes (Signed)
PT taken to PACU. Monitors in place. VSS. Report given to RN. 

## 2018-01-19 ENCOUNTER — Telehealth: Payer: Self-pay | Admitting: *Deleted

## 2018-01-19 NOTE — Telephone Encounter (Signed)
  Follow up Call-  Call back number 01/16/2018  Post procedure Call Back phone  # [phone]  Permission to leave phone message Yes  Some recent data might be hidden     Patient questions:  Do you have a fever, pain , or abdominal swelling? No. Pain Score  0 *  Have you tolerated food without any problems? Yes.    Have you been able to return to your normal activities? Yes.    Do you have any questions about your discharge instructions: Diet   No. Medications  No. Follow up visit  No.  Do you have questions or concerns about your Care? No.  Actions: * If pain score is 4 or above: No action needed, pain <4.

## 2018-06-02 ENCOUNTER — Telehealth: Payer: Self-pay | Admitting: Cardiology

## 2018-06-02 NOTE — Telephone Encounter (Signed)
Advised pt I am not aware of any mask for purchase anywhere.  Advised multiple people are making their own.  She thanked me for the return call and information.

## 2018-06-02 NOTE — Telephone Encounter (Signed)
New Message   Pt is looking for a mask and is wondering if a nurse knows where she can get one  Please call

## 2018-06-12 ENCOUNTER — Ambulatory Visit: Payer: BC Managed Care – PPO | Admitting: Cardiology

## 2018-06-26 ENCOUNTER — Telehealth: Payer: Self-pay

## 2018-06-26 NOTE — Telephone Encounter (Signed)
YOUR CARDIOLOGY TEAM HAS ARRANGED FOR AN E-VISIT FOR YOUR APPOINTMENT - PLEASE REVIEW IMPORTANT INFORMATION BELOW SEVERAL DAYS PRIOR TO YOUR APPOINTMENT  Due to the recent COVID-19 pandemic, we are transitioning in-person office visits to tele-medicine visits in an effort to decrease unnecessary exposure to our patients, their families, and staff. These visits are billed to your insurance just like a normal visit is. We also encourage you to sign up for MyChart if you have not already done so. You will need a smartphone if possible. For patients that do not have this, we can still complete the visit using a regular telephone but do prefer a smartphone to enable video when possible. You may have a family member that lives with you that can help. If possible, we also ask that you have a blood pressure cuff and scale at home to measure your blood pressure, heart rate and weight prior to your scheduled appointment. Patients with clinical needs that need an in-person evaluation and testing will still be able to come to the office if absolutely necessary. If you have any questions, feel free to call our office.     YOUR PROVIDER WILL BE USING THE FOLLOWING PLATFORM TO COMPLETE YOUR VISIT: Doximity  . IF USING MYCHART - How to Download the MyChart App to Your SmartPhone   - If Apple, go to App Store and type in MyChart in the search bar and download the app. If Android, ask patient to go to Google Play Store and type in MyChart in the search bar and download the app. The app is free but as with any other app downloads, your phone may require you to verify saved payment information or Apple/Android password.  - You will need to then log into the app with your MyChart username and password, and select [facility] as your healthcare provider to link the account.  - When it is time for your visit, go to the MyChart app, find appointments, and click Begin Video Visit. Be sure to Select Allow for your device to  access the Microphone and Camera for your visit. You will then be connected, and your provider will be with you shortly.  **If you have any issues connecting or need assistance, please contact MyChart service desk [phone] ([phone])**  **If using a computer, in order to ensure the best quality for your visit, you will need to use either of the following Internet Browsers: Google Chrome or Microsoft Edge**  . IF USING DOXIMITY or [email or web address].ME - The staff will give you instructions on receiving your link to join the meeting the day of your visit.      2-3 DAYS BEFORE YOUR APPOINTMENT  You will receive a telephone call from one of our [facility] team members - your caller ID may say "Unknown caller." If this is a video visit, we will walk you through how to get the video launched on your phone. We will remind you check your blood pressure, heart rate and weight prior to your scheduled appointment. If you have an Apple Watch or Kardia, please upload any pertinent ECG strips the day before or morning of your appointment to MyChart. Our staff will also make sure you have reviewed the consent and agree to move forward with your scheduled tele-health visit.     THE DAY OF YOUR APPOINTMENT  Approximately 15 minutes prior to your scheduled appointment, you will receive a telephone call from one of HeartCare team - your caller ID may say "Unknown caller."    Our staff will confirm medications, vital signs for the day and any symptoms you may be experiencing. Please have this information available prior to the time of visit start. It may also be helpful for you to have a pad of paper and pen handy for any instructions given during your visit. They will also walk you through joining the smartphone meeting if this is a video visit.    CONSENT FOR TELE-HEALTH VISIT - PLEASE REVIEW  I hereby voluntarily request, consent and authorize [facility] and its employed or contracted physicians, physician  assistants, nurse practitioners or other licensed health care professionals (the Practitioner), to provide me with telemedicine health care services (the "Services") as deemed necessary by the treating Practitioner. I acknowledge and consent to receive the Services by the Practitioner via telemedicine. I understand that the telemedicine visit will involve communicating with the Practitioner through live audiovisual communication technology and the disclosure of certain medical information by electronic transmission. I acknowledge that I have been given the opportunity to request an in-person assessment or other available alternative prior to the telemedicine visit and am voluntarily participating in the telemedicine visit.  I understand that I have the right to withhold or withdraw my consent to the use of telemedicine in the course of my care at any time, without affecting my right to future care or treatment, and that the Practitioner or I may terminate the telemedicine visit at any time. I understand that I have the right to inspect all information obtained and/or recorded in the course of the telemedicine visit and may receive copies of available information for a reasonable fee.  I understand that some of the potential risks of receiving the Services via telemedicine include:  . Delay or interruption in medical evaluation due to technological equipment failure or disruption; . Information transmitted may not be sufficient (e.g. poor resolution of images) to allow for appropriate medical decision making by the Practitioner; and/or  . In rare instances, security protocols could fail, causing a breach of personal health information.  Furthermore, I acknowledge that it is my responsibility to provide information about my medical history, conditions and care that is complete and accurate to the best of my ability. I acknowledge that Practitioner's advice, recommendations, and/or decision may be based on  factors not within their control, such as incomplete or inaccurate data provided by me or distortions of diagnostic images or specimens that may result from electronic transmissions. I understand that the practice of medicine is not an exact science and that Practitioner makes no warranties or guarantees regarding treatment outcomes. I acknowledge that I will receive a copy of this consent concurrently upon execution via email to the email address I last provided but may also request a printed copy by calling the office of [facility].    I understand that my insurance will be billed for this visit.   I have read or had this consent read to me. . I understand the contents of this consent, which adequately explains the benefits and risks of the Services being provided via telemedicine.  . I have been provided ample opportunity to ask questions regarding this consent and the Services and have had my questions answered to my satisfaction. . I give my informed consent for the services to be provided through the use of telemedicine in my medical care  By participating in this telemedicine visit I agree to the above.  

## 2018-07-07 ENCOUNTER — Telehealth: Payer: BC Managed Care – PPO | Admitting: Cardiology

## 2018-07-08 ENCOUNTER — Other Ambulatory Visit: Payer: Self-pay

## 2018-07-08 ENCOUNTER — Telehealth (INDEPENDENT_AMBULATORY_CARE_PROVIDER_SITE_OTHER): Payer: BC Managed Care – PPO | Admitting: Cardiology

## 2018-07-08 ENCOUNTER — Encounter: Payer: Self-pay | Admitting: Cardiology

## 2018-07-08 VITALS — Ht 62.0 in

## 2018-07-08 DIAGNOSIS — I10 Essential (primary) hypertension: Secondary | ICD-10-CM

## 2018-07-08 DIAGNOSIS — E7800 Pure hypercholesterolemia, unspecified: Secondary | ICD-10-CM | POA: Insufficient documentation

## 2018-07-08 DIAGNOSIS — I2510 Atherosclerotic heart disease of native coronary artery without angina pectoris: Secondary | ICD-10-CM

## 2018-07-08 NOTE — Progress Notes (Signed)
Virtual Visit via Video Note   This visit type was conducted due to national recommendations for restrictions regarding the COVID-19 Pandemic (e.g. social distancing) in an effort to limit this patient's exposure and mitigate transmission in our community.  Due to her co-morbid illnesses, this patient is at least at moderate risk for complications without adequate follow up.  This format is felt to be most appropriate for this patient at this time.  All issues noted in this document were discussed and addressed.  A limited physical exam was performed with this format.  Please refer to the patient's chart for her consent to telehealth for [facility].   Date:  07/08/2018   ID:  Suzanne Donaldson, DOB 1960/08/30, MRN [id number]  Patient Location: Home Provider Location: Home  PCP:  Britt Bottom, MD  Cardiologist:  Donato Schultz, MD  Electrophysiologist:  None   Evaluation Performed:  Follow-Up Visit  Chief Complaint: Coronary artery disease follow-up  History of Present Illness:     Suzanne Donaldson is a 58 y.o. female with coronary artery disease, prior cath in 2009 without stenting treated medically with stress test 06/04/2017:Nuclear stress EF: 62%.  Blood pressure demonstrated a normal response to exercise.  There was no ST segment deviation noted during stress.  The study is normal.  This is a low risk study.  The left ventricular ejection fraction is normal (55-65%).    Her father and mother both had heart disease, MI late 1s, sister has a pacemaker.  Crestor previously drove her crazy.  Could not move in the morning.  Nuclear stress test in 2019 as well as 2015 showed no evidence of ischemia.  She has been compliant with her medications.  Tried her on atorvastatin 80 mg.  Has had mild SOB but allergy shots have help.  Denies any chest pain or anginal-like symptoms.  She is tolerating her atorvastatin very well.  No myalgias.  The patient does not have symptoms  concerning for COVID-19 infection (fever, chills, cough, or new shortness of breath).    Past Medical History:  Diagnosis Date  . Allergy   . Bursitis   . Bursitis    right wrist  . Cataract    removed by surgery - bilateral  . Hypercholesteremia   . Hypertension    Past Surgical History:  Procedure Laterality Date  . ANAL FISTULOTOMY    . CARDIAC CATHETERIZATION    . CATARACT EXTRACTION Bilateral   . EXPLORATORY LAPAROTOMY    . EYE SURGERY Left    floaters - laser  . wisdom teeth ext       Current Meds  Medication Sig  . atorvastatin (LIPITOR) 80 MG tablet Take 1 tablet (80 mg total) by mouth daily.  Marland Kitchen BELVIQ 10 MG TABS Take 1 tablet by mouth daily.  Marland Kitchen diltiazem (CARDIZEM) 120 MG tablet Take 120 mg by mouth daily.  . fluticasone (FLONASE) 50 MCG/ACT nasal spray Place 1 spray into both nostrils daily.  . furosemide (LASIX) 20 MG tablet Take 20 mg by mouth.  . loratadine (CLARITIN) 10 MG tablet Take 10 mg by mouth daily.  . metaxalone (SKELAXIN) 800 MG tablet metaxalone 800 mg tablet  . naproxen (NAPROSYN) 375 MG tablet Take by mouth.  . nebivolol (BYSTOLIC) 10 MG tablet Take 10 mg by mouth daily.  . nitroGLYCERIN (NITROSTAT) 0.4 MG SL tablet Place 0.4 mg under the tongue every 5 (five) minutes as needed for chest pain.  Marland Kitchen UNABLE TO FIND Allergy injections- 2 injection  q weekly given by [facility]  . Vitamin D, Ergocalciferol, (DRISDOL) 50000 units CAPS capsule Take 1 capsule by mouth once a week.     Allergies:   Aspirin; Biaxin [clarithromycin]; Penicillins; Ceftin [cefuroxime axetil]; Cortisone; Cortizone-10 [hydrocortisone]; Prednisone; and Sulfur   Social History   Tobacco Use  . Smoking status: Never Smoker  . Smokeless tobacco: Never Used  Substance Use Topics  . Alcohol use: No  . Drug use: No     Family Hx: The patient's family history includes Diabetes in her father and sister; Heart attack in her maternal grandfather; Heart disease in her father,  mother, and sister; Hypertension in her father, mother, and sister. There is no history of Colon cancer, Breast cancer, Mental illness, Rectal cancer, or Stomach cancer.  ROS:   Please see the history of present illness.    No fever chills nausea vomiting syncope All other systems reviewed and are negative.   Prior CV studies:   The following studies were reviewed today:  Stress test reviewed as above  Labs/Other Tests and Data Reviewed:    EKG:  An ECG dated 05/27/2017 was personally reviewed today and demonstrated:  Sinus rhythm with nonspecific ST-T wave changes  Recent Labs: 07/30/2017: ALT 20   Recent Lipid Panel Lab Results  Component Value Date/Time   CHOL 133 07/30/2017 07:50 AM   TRIG 85 07/30/2017 07:50 AM   HDL 55 07/30/2017 07:50 AM   CHOLHDL 2.4 07/30/2017 07:50 AM   LDLCALC 61 07/30/2017 07:50 AM    Wt Readings from Last 3 Encounters:  01/16/18 186 lb (84.4 kg)  12/15/17 186 lb 6.4 oz (84.6 kg)  06/04/17 193 lb (87.5 kg)     Objective:    Vital Signs:  Ht 5\' 2"  (1.575 m)   LMP  (LMP Unknown)   BMI 34.02 kg/m    VITAL SIGNS:  reviewed GEN:  no acute distress EYES:  sclerae anicteric, EOMI - Extraocular Movements Intact RESPIRATORY:  normal respiratory effort, symmetric expansion SKIN:  no rash, lesions or ulcers. MUSCULOSKELETAL:  no obvious deformities. NEURO:  alert and oriented x 3, no obvious focal deficit PSYCH:  normal affect  ASSESSMENT & PLAN:    Coronary artery disease -Continue with aggressive secondary prevention.  No PCI previously.  2015 2019 nuclear stress test were low risk with no ischemia.  Aggressive secondary risk factor prevention. -She does have an aspirin allergy.  Hives.  She is not taking.  At one point after the catheterization she took Plavix for a little while but then this was stopped.  Hyperlipidemia -Doing excellently with atorvastatin 80 mg.  Had an intolerance to Crestor in the past.  She is doing great with  atorvastatin without any myalgias.  At last check her LDL was 58.  Essential hypertension - Blood pressures have been excellent.  Doing well.  No changes.    COVID-19 Education: The signs and symptoms of COVID-19 were discussed with the patient and how to seek care for testing (follow up with PCP or arrange E-visit).  The importance of social distancing was discussed today.  Time:   Today, I have spent 7 minutes with the patient with telehealth technology discussing the above problems.     Medication Adjustments/Labs and Tests Ordered: Current medicines are reviewed at length with the patient today.  Concerns regarding medicines are outlined above.   Tests Ordered: No orders of the defined types were placed in this encounter.   Medication Changes: No orders of the defined  types were placed in this encounter.   Disposition:  Follow up in 1 year(s)  Signed, Donato Schultz, MD  07/08/2018 3:23 PM    [facility]

## 2018-07-08 NOTE — Patient Instructions (Signed)
  Medication Instructions:  The current medical regimen is effective;  continue present plan and medications.  If you need a refill on your cardiac medications before your next appointment, please call your pharmacy.   Follow-Up: Follow up in 1 year with Dr. Skains.  You will receive a letter in the mail 2 months before you are due.  Please call us when you receive this letter to schedule your follow up appointment.  Thank you for choosing [facility]!!     

## 2018-09-02 ENCOUNTER — Other Ambulatory Visit: Payer: Self-pay | Admitting: Cardiology

## 2018-09-20 ENCOUNTER — Encounter (HOSPITAL_COMMUNITY): Payer: Self-pay | Admitting: Emergency Medicine

## 2018-09-20 ENCOUNTER — Ambulatory Visit (HOSPITAL_COMMUNITY)
Admission: EM | Admit: 2018-09-20 | Discharge: 2018-09-20 | Disposition: A | Discharge status: Home / Self Care | Payer: BC Managed Care – PPO | Attending: Family Medicine | Admitting: Family Medicine

## 2018-09-20 DIAGNOSIS — N3001 Acute cystitis with hematuria: Secondary | ICD-10-CM | POA: Diagnosis not present

## 2018-09-20 LAB — POCT URINALYSIS DIP (DEVICE)
Bilirubin Urine: NEGATIVE
Glucose, UA: NEGATIVE mg/dL
Ketones, ur: NEGATIVE mg/dL
Nitrite: NEGATIVE
Protein, ur: 100 mg/dL — AB
Specific Gravity, Urine: 1.015 (ref 1.005–1.030)
Urobilinogen, UA: 0.2 mg/dL (ref 0.0–1.0)
pH: 7 (ref 5.0–8.0)

## 2018-09-20 MED ORDER — PHENAZOPYRIDINE HCL 200 MG PO TABS: 200.0000 mg | ORAL_TABLET | Freq: Three times a day (TID) | ORAL | 0 refills | Status: DC | PRN

## 2018-09-20 MED ORDER — CIPROFLOXACIN HCL 500 MG PO TABS: 500.0000 mg | ORAL_TABLET | Freq: Two times a day (BID) | ORAL | 0 refills | Status: DC

## 2018-09-20 NOTE — ED Triage Notes (Addendum)
Pt states "I think its either a UTI or a kidney infection, at the end of June I was treated for one and it went away but I think it came back".  C/o urinary frequency and dysuria since Wednesday. Marland Kitchen

## 2018-09-20 NOTE — ED Provider Notes (Signed)
[place]    CSN: [id number] Arrival date & time: 09/20/18  1110      History   Chief Complaint Chief Complaint  Patient presents with  . Dysuria  . Urinary Frequency    HPI Suzanne Donaldson is a 58 y.o. female.   HPI  Suzanne Donaldson is a 58 y.o. female presents for evaluation of urinary frequency, urgency and dysuria x 7 days, without flank pain, fever, chills, or abnormal vaginal discharge or bleeding. Reports no concern for STD. Previous history of UTI with treatment on June 30. She was diagnosed and treated for acute UTI by PCP via a telemedicine encounter with 7 days of Macrobid.  Symptoms initially resolved however returned approximately 7 days ago.  She denies any visible hematuria.  She has no history of frequent UTIs.   Past Medical History:  Diagnosis Date  . Allergy   . Bursitis   . Bursitis    right wrist  . Cataract    removed by surgery - bilateral  . Hypercholesteremia   . Hypertension     Patient Active Problem List   Diagnosis Date Noted  . Coronary artery disease involving native coronary artery of native heart without angina pectoris 07/08/2018  . Essential hypertension 07/08/2018  . Pure hypercholesterolemia 07/08/2018    Past Surgical History:  Procedure Laterality Date  . ANAL FISTULOTOMY    . CARDIAC CATHETERIZATION    . CATARACT EXTRACTION Bilateral   . EXPLORATORY LAPAROTOMY    . EYE SURGERY Left    floaters - laser  . wisdom teeth ext      OB History   No obstetric history on file.      Home Medications    Prior to Admission medications   Medication Sig Start Date End Date Taking? Authorizing Provider  atorvastatin (LIPITOR) 80 MG tablet Take 1 tablet by mouth once daily 09/02/18   Jerline Pain, MD  BELVIQ 10 MG TABS Take 1 tablet by mouth daily. 04/18/17   [provider]  ciprofloxacin (CIPRO) 500 MG tablet Take 1 tablet (500 mg total) by mouth 2 (two) times daily. 09/20/18   Scot Jun, FNP   diltiazem (CARDIZEM) 120 MG tablet Take 120 mg by mouth daily.    [provider]  fluticasone (FLONASE) 50 MCG/ACT nasal spray Place 1 spray into both nostrils daily. 11/30/17   Zigmund Gottron, NP  furosemide (LASIX) 20 MG tablet Take 20 mg by mouth.    [provider]  loratadine (CLARITIN) 10 MG tablet Take 10 mg by mouth daily.    [provider]  metaxalone (SKELAXIN) 800 MG tablet metaxalone 800 mg tablet    [provider]  naproxen (NAPROSYN) 375 MG tablet Take by mouth.    [provider]  nebivolol (BYSTOLIC) 10 MG tablet Take 10 mg by mouth daily.    [provider]  nitroGLYCERIN (NITROSTAT) 0.4 MG SL tablet Place 0.4 mg under the tongue every 5 (five) minutes as needed for chest pain.    [provider]  phenazopyridine (PYRIDIUM) 200 MG tablet Take 1 tablet (200 mg total) by mouth 3 (three) times daily as needed for pain. 09/20/18   Scot Jun, FNP  UNABLE TO FIND Allergy injections- 2 injection q weekly given by [facility]    [provider]  Vitamin D, Ergocalciferol, (DRISDOL) 50000 units CAPS capsule Take 1 capsule by mouth once a week. 05/07/17   [provider]    Family  History Family History  Problem Relation Age of Onset  . Heart disease Mother   . Hypertension Mother   . Heart disease Father   . Hypertension Father   . Diabetes Father   . Heart disease Sister   . Hypertension Sister   . Diabetes Sister   . Heart attack Maternal Grandfather   . Colon cancer Neg Hx   . Breast cancer Neg Hx   . Mental illness Neg Hx   . Rectal cancer Neg Hx   . Stomach cancer Neg Hx     Social History Social History   Tobacco Use  . Smoking status: Never Smoker  . Smokeless tobacco: Never Used  Substance Use Topics  . Alcohol use: No  . Drug use: No     Allergies   Aspirin, Biaxin [clarithromycin], Penicillins, Ceftin [cefuroxime axetil], Cortisone, Cortizone-10  [hydrocortisone], Prednisone, and Sulfur   Review of Systems Review of Systems Pertinent negatives listed in HPI  Physical Exam Triage Vital Signs ED Triage Vitals  Enc Vitals Group     BP 09/20/18 1202 (!) 145/69     Pulse Rate 09/20/18 1202 66     Resp 09/20/18 1202 16     Temp 09/20/18 1202 97.9 F (36.6 C)     Temp src --      SpO2 09/20/18 1202 100 %     Weight --      Height --      Head Circumference --      Peak Flow --      Pain Score 09/20/18 1204 0     Pain Loc --      Pain Edu? --      Excl. in [place]? --    No data found.  Updated Vital Signs BP (!) 145/69   Pulse 66   Temp 97.9 F (36.6 C)   Resp 16   LMP  (LMP Unknown)   SpO2 100%   Visual Acuity Right Eye Distance:   Left Eye Distance:   Bilateral Distance:    Right Eye Near:   Left Eye Near:    Bilateral Near:     Physical Exam General appearance: alert, well developed, well nourished, cooperative and in no distress Head: Normocephalic, without obvious abnormality, atraumatic Respiratory: Respirations even and unlabored, normal respiratory rate Heart: rate and rhythm normal. No gallop or murmurs noted on exam  Extremities: No gross deformities Skin: Skin color, texture, turgor normal. No rashes seen  Psych: Appropriate mood and affect. Neurologic: Mental status: Alert, oriented to person, place, and time, thought content appropriate.  UC Treatments / Results  Labs (all labs ordered are listed, but only abnormal results are displayed) Labs Reviewed  POCT URINALYSIS DIP (DEVICE) - Abnormal; Notable for the following components:      Result Value   Hgb urine dipstick LARGE (*)    Protein, ur 100 (*)    Leukocytes,Ua MODERATE (*)    All other components within normal limits  URINE CULTURE    EKG   Radiology No results found.  Procedures Procedures (including critical care time)  Medications Ordered in UC Medications - No data to display  Initial Impression / Assessment and  Plan / UC Course  I have reviewed the triage vital signs and the nursing notes.  Pertinent labs & imaging results that were available during my care of the patient were reviewed by me and considered in my medical decision making (see chart for details).    Treating empirically with  ciprofloxacin 500 mg twice daily x7 days for an acute urinary infection.  Patient has multiple allergies therefore opted for ciprofloxacin 500 mg.  Red flags discussed extensively with patient.  Urine culture is pending.  Patient advised that she would be notified if antibiotic therapy needs to be changed based on culture results.  Patient verbalized understanding and agreement with plan. Final Clinical Impressions(s) / UC Diagnoses   Final diagnoses:  Acute cystitis with hematuria   Discharge Instructions   None    ED Prescriptions    Medication Sig Dispense Auth. Provider   ciprofloxacin (CIPRO) 500 MG tablet Take 1 tablet (500 mg total) by mouth 2 (two) times daily. 14 tablet Bing NeighborsHarris, Kelise Kuch S, FNP   phenazopyridine (PYRIDIUM) 200 MG tablet Take 1 tablet (200 mg total) by mouth 3 (three) times daily as needed for pain. 10 tablet Bing NeighborsHarris, Bobie Kistler S, FNP     Controlled Substance Prescriptions [place] Controlled Substance Registry consulted? Not Applicable   Bing NeighborsHarris, Ronalee Scheunemann S, FNP 09/20/18 1308

## 2018-09-21 LAB — URINE CULTURE: Culture: <10000 — AB

## 2018-11-24 ENCOUNTER — Other Ambulatory Visit: Payer: Self-pay | Admitting: Family Medicine

## 2018-11-24 DIAGNOSIS — Z1231 Encounter for screening mammogram for malignant neoplasm of breast: Secondary | ICD-10-CM

## 2018-12-19 ENCOUNTER — Encounter (HOSPITAL_COMMUNITY): Payer: Self-pay | Admitting: *Deleted

## 2018-12-19 ENCOUNTER — Ambulatory Visit (INDEPENDENT_AMBULATORY_CARE_PROVIDER_SITE_OTHER): Payer: BC Managed Care – PPO

## 2018-12-19 ENCOUNTER — Other Ambulatory Visit: Payer: Self-pay

## 2018-12-19 ENCOUNTER — Ambulatory Visit (HOSPITAL_COMMUNITY)
Admission: EM | Admit: 2018-12-19 | Discharge: 2018-12-19 | Disposition: A | Discharge status: Home / Self Care | Payer: BC Managed Care – PPO

## 2018-12-19 DIAGNOSIS — I10 Essential (primary) hypertension: Secondary | ICD-10-CM

## 2018-12-19 DIAGNOSIS — S9031XA Contusion of right foot, initial encounter: Secondary | ICD-10-CM | POA: Diagnosis not present

## 2018-12-19 NOTE — Discharge Instructions (Addendum)
Take Tylenol or ibuprofen as needed for discomfort.  Rest and elevate your foot.  Apply ice packs 2-3 times a day for up to 20 minutes each.  Wear the Ace wrap as needed for comfort.    Follow up with an orthopedist if you symptoms continue or worsen;  Or if you develop new symptoms, such as numbness, tingling, or weakness.    Your blood pressure is elevated today at 153/97.  Please have this rechecked by your primary care provider in 2-4 weeks.

## 2018-12-19 NOTE — ED Provider Notes (Signed)
[facility]    CSN: [id number] Arrival date & time: 12/19/18  1000      History   Chief Complaint Chief Complaint  Patient presents with  . Foot Injury    HPI Suzanne Donaldson is a 58 y.o. female.   Patient presents with right foot swelling and pain after tripping over a vacuum cord yesterday.  She denies numbness, weakness, paresthesias.  She denies head injury or LOC.  No treatments attempted at home.  LMP: Postmenopausal.  No medical history of prior injury or surgery to this area.  The history is provided by the patient.    Past Medical History:  Diagnosis Date  . Allergy   . Bursitis   . Bursitis    right wrist  . Cataract    removed by surgery - bilateral  . Hypercholesteremia   . Hypertension     Patient Active Problem List   Diagnosis Date Noted  . Coronary artery disease involving native coronary artery of native heart without angina pectoris 07/08/2018  . Essential hypertension 07/08/2018  . Pure hypercholesterolemia 07/08/2018    Past Surgical History:  Procedure Laterality Date  . ANAL FISTULOTOMY    . CARDIAC CATHETERIZATION    . CATARACT EXTRACTION Bilateral   . EXPLORATORY LAPAROTOMY    . EYE SURGERY Left    floaters - laser  . wisdom teeth ext      OB History   No obstetric history on file.      Home Medications    Prior to Admission medications   Medication Sig Start Date End Date Taking? Authorizing Provider  atorvastatin (LIPITOR) 80 MG tablet Take 1 tablet by mouth once daily 09/02/18  Yes Skains, Veverly Fells, MD  diltiazem (CARDIZEM) 120 MG tablet Take 120 mg by mouth daily.   Yes [provider]  furosemide (LASIX) 20 MG tablet Take 20 mg by mouth.   Yes [provider]  loratadine (CLARITIN) 10 MG tablet Take 10 mg by mouth daily.   Yes [provider]  nebivolol (BYSTOLIC) 10 MG tablet Take 10 mg by mouth daily.   Yes [provider]  UNKNOWN TO PATIENT Daily injections for weight loss    Yes [provider]  Vitamin D, Ergocalciferol, (DRISDOL) 50000 units CAPS capsule Take 1 capsule by mouth once a week. 05/07/17  Yes [provider]  BELVIQ 10 MG TABS Take 1 tablet by mouth daily. 04/18/17   [provider]  ciprofloxacin (CIPRO) 500 MG tablet Take 1 tablet (500 mg total) by mouth 2 (two) times daily. 09/20/18   Bing Neighbors, FNP  fluticasone (FLONASE) 50 MCG/ACT nasal spray Place 1 spray into both nostrils daily. 11/30/17   Georgetta Haber, NP  metaxalone (SKELAXIN) 800 MG tablet metaxalone 800 mg tablet    [provider]  naproxen (NAPROSYN) 375 MG tablet Take by mouth.    [provider]  nitroGLYCERIN (NITROSTAT) 0.4 MG SL tablet Place 0.4 mg under the tongue every 5 (five) minutes as needed for chest pain.    [provider]  phenazopyridine (PYRIDIUM) 200 MG tablet Take 1 tablet (200 mg total) by mouth 3 (three) times daily as needed for pain. 09/20/18   Bing Neighbors, FNP  UNABLE TO FIND Allergy injections- 2 injection q weekly given by [facility]    [provider]    Family History Family History  Problem Relation Age of Onset  . Heart disease Mother   . Hypertension Mother   .  Heart disease Father   . Hypertension Father   . Diabetes Father   . Heart disease Sister   . Hypertension Sister   . Diabetes Sister   . Heart attack Maternal Grandfather   . Colon cancer Neg Hx   . Breast cancer Neg Hx   . Mental illness Neg Hx   . Rectal cancer Neg Hx   . Stomach cancer Neg Hx     Social History Social History   Tobacco Use  . Smoking status: Never Smoker  . Smokeless tobacco: Never Used  Substance Use Topics  . Alcohol use: No  . Drug use: No     Allergies   Aspirin, Biaxin [clarithromycin], Penicillins, Ceftin [cefuroxime axetil], Cortisone, Cortizone-10 [hydrocortisone], Prednisone, and Sulfur   Review of Systems Review of Systems  Constitutional: Negative for chills and  fever.  HENT: Negative for ear pain and sore throat.   Eyes: Negative for pain and visual disturbance.  Respiratory: Negative for cough and shortness of breath.   Cardiovascular: Negative for chest pain and palpitations.  Gastrointestinal: Negative for abdominal pain and vomiting.  Genitourinary: Negative for dysuria and hematuria.  Musculoskeletal: Positive for arthralgias. Negative for back pain.  Skin: Negative for color change and rash.  Neurological: Negative for seizures, syncope, weakness and numbness.  All other systems reviewed and are negative.    Physical Exam Triage Vital Signs ED Triage Vitals  Enc Vitals Group     BP      Pulse      Resp      Temp      Temp src      SpO2      Weight      Height      Head Circumference      Peak Flow      Pain Score      Pain Loc      Pain Edu?      Excl. in [place]?    No data found.  Updated Vital Signs BP (!) 153/97 Comment: took HTN med this AM  Pulse 77   Temp 97.8 F (36.6 C) (Other (Comment))   Resp 18   LMP  (LMP Unknown)   SpO2 98%   Visual Acuity Right Eye Distance:   Left Eye Distance:   Bilateral Distance:    Right Eye Near:   Left Eye Near:    Bilateral Near:     Physical Exam Vitals signs and nursing note reviewed.  Constitutional:      General: She is not in acute distress.    Appearance: She is well-developed.  HENT:     Head: Normocephalic and atraumatic.     Mouth/Throat:     Mouth: Mucous membranes are moist.     Pharynx: Oropharynx is clear.  Eyes:     Conjunctiva/sclera: Conjunctivae normal.  Neck:     Musculoskeletal: Neck supple.  Cardiovascular:     Rate and Rhythm: Normal rate and regular rhythm.     Heart sounds: No murmur.  Pulmonary:     Effort: Pulmonary effort is normal. No respiratory distress.     Breath sounds: Normal breath sounds.  Abdominal:     Palpations: Abdomen is soft.     Tenderness: There is no abdominal tenderness. There is no guarding or rebound.   Musculoskeletal: Normal range of motion.        General: Swelling and tenderness present. No deformity.       Feet:  Skin:  General: Skin is warm and dry.     Capillary Refill: Capillary refill takes less than 2 seconds.     Findings: No bruising, erythema or lesion.  Neurological:     General: No focal deficit present.     Mental Status: She is alert and oriented to person, place, and time.     Sensory: No sensory deficit.     Motor: No weakness.     Gait: Gait abnormal.     Comments: Limping gait      UC Treatments / Results  Labs (all labs ordered are listed, but only abnormal results are displayed) Labs Reviewed - No data to display  EKG   Radiology Dg Foot Complete Right  Result Date: 12/19/2018 CLINICAL DATA:  Fall, injury, pain EXAM: RIGHT FOOT COMPLETE - 3+ VIEW COMPARISON:  None. FINDINGS: Bones are osteopenic. No definite acute osseous finding, fracture, subluxation, or dislocation. Mild soft tissue swelling dorsally. Tarsal bones appear intact. Slight progression of tarsal bone degenerative change on lateral view. IMPRESSION: No acute finding by plain radiography Electronically Signed   By: Judie PetitM.  Shick M.D.   On: 12/19/2018 11:10    Procedures Procedures (including critical care time)  Medications Ordered in UC Medications - No data to display  Initial Impression / Assessment and Plan / UC Course  I have reviewed the triage vital signs and the nursing notes.  Pertinent labs & imaging results that were available during my care of the patient were reviewed by me and considered in my medical decision making (see chart for details).    Contusion of right foot.  Elevated blood pressure with known hypertension.  Treating with Tylenol or ibuprofen, Ace wrap, rest and elevation, ice packs.  Instructed patient to follow-up with an orthopedist if her symptoms persist or worsen or she develops new symptoms such as numbness or paresthesias.  Discussed with patient that  her blood pressure is elevated and needs to be rechecked by her PCP in 2 to 4 weeks.  Patient agrees to plan of care.     Final Clinical Impressions(s) / UC Diagnoses   Final diagnoses:  Elevated blood pressure reading in office with diagnosis of hypertension  Contusion of right foot, initial encounter     Discharge Instructions     Take Tylenol or ibuprofen as needed for discomfort.  Rest and elevate your foot.  Apply ice packs 2-3 times a day for up to 20 minutes each.  Wear the Ace wrap as needed for comfort.    Follow up with an orthopedist if you symptoms continue or worsen;  Or if you develop new symptoms, such as numbness, tingling, or weakness.    Your blood pressure is elevated today at 153/97.  Please have this rechecked by your primary care provider in 2-4 weeks.        ED Prescriptions    None     PDMP not reviewed this encounter.   Mickie Bailate, Brennan Karam H, NP 12/19/18 1116

## 2018-12-19 NOTE — ED Triage Notes (Signed)
Reports stumbling over vacuum cord yesterday, injuring right foot.  Denies ankle pain.  C/O swelling and pain to majority of right foot.  CMS intact.  Ambulates with limp.

## 2018-12-25 ENCOUNTER — Ambulatory Visit
Admission: RE | Admit: 2018-12-25 | Discharge: 2018-12-25 | Disposition: A | Discharge status: Home / Self Care | Payer: BC Managed Care – PPO | Source: Ambulatory Visit | Attending: Family Medicine | Admitting: Family Medicine

## 2018-12-25 DIAGNOSIS — Z1231 Encounter for screening mammogram for malignant neoplasm of breast: Secondary | ICD-10-CM | POA: Diagnosis present

## 2019-05-22 ENCOUNTER — Ambulatory Visit: Payer: BC Managed Care – PPO

## 2019-09-26 NOTE — Progress Notes (Signed)
Cardiology Office Note:    Date:  09/28/2019   ID:  Suzanne Donaldson, DOB May 23, 1960, MRN [id number]  PCP:  Britt Bottom, MD  [facility] Cardiologist:  Donato Schultz, MD  [facility] Electrophysiologist:  None   Referring MD: Britt Bottom, *    History of Present Illness:    Suzanne Donaldson is a 59 y.o. female here for the follow-up of coronary artery disease with prior cardiac catheterization in 2009 resulting in medical therapy.  Mother and father both had heart disease with MI late 51s.  Her sister also has a pacemaker.  Has had problems in the past with Crestor-drove her crazy.  Could not move in the morning.  Has had mild shortness of breath in the past but allergy shots seem to have helped.  No myalgias, tolerating her atorvastatin.  Overall been doing quite well without any fevers chills nausea vomiting syncope bleeding chest pain.  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  Past Medical History:  Diagnosis Date  . Allergy   . Bursitis   . Bursitis    right wrist  . Cataract    removed by surgery - bilateral  . Hypercholesteremia   . Hypertension     Past Surgical History:  Procedure Laterality Date  . ANAL FISTULOTOMY    . CARDIAC CATHETERIZATION    . CATARACT EXTRACTION Bilateral   . EXPLORATORY LAPAROTOMY    . EYE SURGERY Left    floaters - laser  . wisdom teeth ext      Current Medications: Current Meds  Medication Sig  . atorvastatin (LIPITOR) 80 MG tablet Take 1 tablet by mouth once daily  . diltiazem (CARDIZEM CD) 180 MG 24 hr capsule Take 180 mg by mouth daily.  . fluticasone (FLONASE) 50 MCG/ACT nasal spray Place 1 spray into both nostrils daily.  . furosemide (LASIX) 20 MG tablet Take 20 mg by mouth.  Marland Kitchen lisinopril (ZESTRIL) 20 MG tablet Take 20 mg by mouth daily.  Marland Kitchen loratadine (CLARITIN) 10 MG tablet Take 10 mg by mouth daily.  . naproxen (NAPROSYN) 375 MG  tablet Take by mouth.  . nebivolol (BYSTOLIC) 10 MG tablet Take 10 mg by mouth daily.  . nitroGLYCERIN (NITROSTAT) 0.4 MG SL tablet Place 0.4 mg under the tongue every 5 (five) minutes as needed for chest pain.  . phenazopyridine (PYRIDIUM) 200 MG tablet Take 1 tablet (200 mg total) by mouth 3 (three) times daily as needed for pain.  Marland Kitchen UNABLE TO FIND Allergy injections- 2 injection q weekly given by [facility]  . UNKNOWN TO PATIENT Daily injections for weight loss  . Vitamin D, Ergocalciferol, (DRISDOL) 50000 units CAPS capsule Take 1 capsule by mouth once a week.     Allergies:   Aspirin, Biaxin [clarithromycin], Penicillins, Ceftin [cefuroxime axetil], Cortisone, Cortizone-10 [hydrocortisone], Prednisone, and Sulfur   Social History   Socioeconomic History  . Marital status: Widowed    Spouse name: Not on file  . Number of children: Not on file  . Years of education: Not on file  . Highest education level: Not on file  Occupational History  . Not on file  Tobacco Use  . Smoking status: Never Smoker  . Smokeless tobacco: Never Used  Vaping Use  . Vaping Use: Never used  Substance and Sexual Activity  . Alcohol use: No  . Drug use: No  . Sexual activity: Not Currently  Birth control/protection: Post-menopausal  Other Topics Concern  . Not on file  Social History Narrative  . Not on file   Social Determinants of Health   Financial Resource Strain:   . Difficulty of Paying Living Expenses:   Food Insecurity:   . Worried About Programme researcher, broadcasting/film/video in the Last Year:   . Barista in the Last Year:   Transportation Needs:   . Freight forwarder (Medical):   Marland Kitchen Lack of Transportation (Non-Medical):   Physical Activity:   . Days of Exercise per Week:   . Minutes of Exercise per Session:   Stress:   . Feeling of Stress :   Social Connections:   . Frequency of Communication with Friends and Family:   . Frequency of Social Gatherings with Friends and Family:     . Attends Religious Services:   . Active Member of Clubs or Organizations:   . Attends Banker Meetings:   Marland Kitchen Marital Status:      Family History: The patient's family history includes Diabetes in her father and sister; Heart attack in her maternal grandfather; Heart disease in her father, mother, and sister; Hypertension in her father, mother, and sister. There is no history of Colon cancer, Breast cancer, Mental illness, Rectal cancer, or Stomach cancer.  ROS:   Please see the history of present illness.     All other systems reviewed and are negative.  EKGs/Labs/Other Studies Reviewed:    The following studies were reviewed today: Nuclear stress test in 2015 and in 2019 both low risk with no ischemia.   EKG:  EKG is  ordered today.  The ekg ordered today demonstrates sinus bradycardia 55 with no other significant abnormalities.  Recent Labs: No results found for requested labs within last 8760 hours.  Recent Lipid Panel    Component Value Date/Time   CHOL 133 07/30/2017 0750   TRIG 85 07/30/2017 0750   HDL 55 07/30/2017 0750   CHOLHDL 2.4 07/30/2017 0750   LDLCALC 61 07/30/2017 0750    Physical Exam:    VS:  BP 120/80   Pulse (!) 55   Ht 5\' 2"  (1.575 m)   Wt 176 lb (79.8 kg)   LMP  (LMP Unknown)   SpO2 94%   BMI 32.19 kg/m     Wt Readings from Last 3 Encounters:  09/28/19 176 lb (79.8 kg)  01/16/18 186 lb (84.4 kg)  12/15/17 186 lb 6.4 oz (84.6 kg)     GEN:  Well nourished, well developed in no acute distress HEENT: Normal NECK: No JVD; No carotid bruits LYMPHATICS: No lymphadenopathy CARDIAC: RRR, no murmurs, rubs, gallops RESPIRATORY:  Clear to auscultation without rales, wheezing or rhonchi  ABDOMEN: Soft, non-tender, non-distended MUSCULOSKELETAL:  No edema; No deformity  SKIN: Warm and dry NEUROLOGIC:  Alert and oriented x 3 PSYCHIATRIC:  Normal affect   ASSESSMENT:    1. Coronary artery disease involving native coronary artery of  native heart without angina pectoris   2. Essential hypertension   3. Pure hypercholesterolemia    PLAN:    In order of problems listed above:  Coronary artery disease -Nonobstructive disease, no PCI previously.  Nuclear stress test have been reassuring latest of which was in 2019. -Has a prior aspirin allergy, hives.  Following catheterization she took Plavix for a few months but then this was stopped. -Continue with aggressive secondary risk factor prevention.  Hyperlipidemia -Atorvastatin 80 mg.  Doing well high intensity dose  with no myalgias.  Prior LDL 61. -Had prior intolerance to Crestor.  Essential hypertension -Blood pressures excellent no changes made.  Her last initiation was lisinopril 20 mg once a day by Dr. Lyndal Rainbow.  Overall under excellent control.  No changes made.  1 year follow-up.  Dr. Lyndal Rainbow has been following blood work closely.   Medication Adjustments/Labs and Tests Ordered: Current medicines are reviewed at length with the patient today.  Concerns regarding medicines are outlined above.  Orders Placed This Encounter  Procedures  . EKG 12-Lead   No orders of the defined types were placed in this encounter.   Patient Instructions  Medication Instructions:  The current medical regimen is effective;  continue present plan and medications.  *If you need a refill on your cardiac medications before your next appointment, please call your pharmacy*  Follow-Up: At [facility], you and your health needs are our priority.  As part of our continuing mission to provide you with exceptional heart care, we have created designated Provider Care Teams.  These Care Teams include your primary Cardiologist (physician) and Advanced Practice Providers (APPs -  Physician Assistants and Nurse Practitioners) who all work together to provide you with the care you need, when you need it.  We recommend signing up for the patient portal called "MyChart".  Sign up information is  provided on this After Visit Summary.  MyChart is used to connect with patients for Virtual Visits (Telemedicine).  Patients are able to view lab/test results, encounter notes, upcoming appointments, etc.  Non-urgent messages can be sent to your provider as well.   To learn more about what you can do with MyChart, go to [email or web address].    Your next appointment:   12 month(s)  The format for your next appointment:   In Person  Provider:   Donato Schultz, MD   Thank you for choosing [facility]!!         Signed, Donato Schultz, MD  09/28/2019 10:29 AM    [facility]

## 2019-09-27 ENCOUNTER — Other Ambulatory Visit: Payer: Self-pay | Admitting: Cardiology

## 2019-09-28 ENCOUNTER — Ambulatory Visit: Payer: BC Managed Care – PPO | Admitting: Cardiology

## 2019-09-28 ENCOUNTER — Other Ambulatory Visit: Payer: Self-pay

## 2019-09-28 ENCOUNTER — Encounter: Payer: Self-pay | Admitting: Cardiology

## 2019-09-28 VITALS — BP 120/80 | HR 55 | Ht 62.0 in | Wt 176.0 lb

## 2019-09-28 DIAGNOSIS — I10 Essential (primary) hypertension: Secondary | ICD-10-CM

## 2019-09-28 DIAGNOSIS — E7800 Pure hypercholesterolemia, unspecified: Secondary | ICD-10-CM

## 2019-09-28 DIAGNOSIS — I2510 Atherosclerotic heart disease of native coronary artery without angina pectoris: Secondary | ICD-10-CM

## 2019-09-28 NOTE — Patient Instructions (Signed)
Medication Instructions:  The current medical regimen is effective;  continue present plan and medications.  *If you need a refill on your cardiac medications before your next appointment, please call your pharmacy*  Follow-Up: At [facility], you and your health needs are our priority.  As part of our continuing mission to provide you with exceptional heart care, we have created designated Provider Care Teams.  These Care Teams include your primary Cardiologist (physician) and Advanced Practice Providers (APPs -  Physician Assistants and Nurse Practitioners) who all work together to provide you with the care you need, when you need it.  We recommend signing up for the patient portal called "MyChart".  Sign up information is provided on this After Visit Summary.  MyChart is used to connect with patients for Virtual Visits (Telemedicine).  Patients are able to view lab/test results, encounter notes, upcoming appointments, etc.  Non-urgent messages can be sent to your provider as well.   To learn more about what you can do with MyChart, go to [email or web address].    Your next appointment:   12 month(s)  The format for your next appointment:   In Person  Provider:   Mark Skains, MD   Thank you for choosing [facility]!!      

## 2019-11-19 ENCOUNTER — Ambulatory Visit
Admission: EM | Admit: 2019-11-19 | Discharge: 2019-11-19 | Disposition: A | Discharge status: Home / Self Care | Payer: BC Managed Care – PPO

## 2019-11-19 DIAGNOSIS — J069 Acute upper respiratory infection, unspecified: Secondary | ICD-10-CM | POA: Diagnosis not present

## 2019-11-19 DIAGNOSIS — H9203 Otalgia, bilateral: Secondary | ICD-10-CM | POA: Diagnosis not present

## 2019-11-19 NOTE — ED Provider Notes (Signed)
[name]    CSN: [id number] Arrival date & time: 11/19/19  1547      History   Chief Complaint Chief Complaint  Patient presents with  . Otalgia    HPI Seleni Meller is a 59 y.o. female.   Patient presents with bilateral ear pain x2 days and nasal congestion today.  Patient was on an antibiotic in July for her ears she reports.  She denies fever, sore throat, cough, shortness of breath, vomiting, diarrhea, or other symptoms.  Patient declines COVID test.  The history is provided by the patient.    Past Medical History:  Diagnosis Date  . Allergy   . Bursitis   . Bursitis    right wrist  . Cataract    removed by surgery - bilateral  . Hypercholesteremia   . Hypertension     Patient Active Problem List   Diagnosis Date Noted  . Coronary artery disease involving native coronary artery of native heart without angina pectoris 07/08/2018  . Essential hypertension 07/08/2018  . Pure hypercholesterolemia 07/08/2018    Past Surgical History:  Procedure Laterality Date  . ANAL FISTULOTOMY    . CARDIAC CATHETERIZATION    . CATARACT EXTRACTION Bilateral   . EXPLORATORY LAPAROTOMY    . EYE SURGERY Left    floaters - laser  . wisdom teeth ext      OB History   No obstetric history on file.      Home Medications    Prior to Admission medications   Medication Sig Start Date End Date Taking? Authorizing Provider  atorvastatin (LIPITOR) 80 MG tablet Take 1 tablet by mouth once daily 09/28/19  Yes Skains, Veverly Fells, MD  diltiazem (CARDIZEM CD) 180 MG 24 hr capsule Take 180 mg by mouth daily. 09/27/19  Yes [provider]  furosemide (LASIX) 20 MG tablet Take 20 mg by mouth.   Yes [provider]  lisinopril (ZESTRIL) 20 MG tablet Take 20 mg by mouth daily. 08/20/19  Yes [provider]  loratadine (CLARITIN) 10 MG tablet Take 10 mg by mouth daily.   Yes [provider]  nebivolol (BYSTOLIC) 10 MG tablet Take 10 mg by mouth  daily.   Yes [provider]  Vitamin D, Ergocalciferol, (DRISDOL) 50000 units CAPS capsule Take 1 capsule by mouth once a week. 05/07/17  Yes [provider]  fluticasone (FLONASE) 50 MCG/ACT nasal spray Place 1 spray into both nostrils daily. 11/30/17   Linus Mako B, NP  naproxen (NAPROSYN) 375 MG tablet Take by mouth.    [provider]  nitroGLYCERIN (NITROSTAT) 0.4 MG SL tablet Place 0.4 mg under the tongue every 5 (five) minutes as needed for chest pain.    [provider]  phenazopyridine (PYRIDIUM) 200 MG tablet Take 1 tablet (200 mg total) by mouth 3 (three) times daily as needed for pain. 09/20/18   Bing Neighbors, FNP  UNABLE TO FIND Allergy injections- 2 injection q weekly given by [facility]    [provider]  UNKNOWN TO PATIENT Daily injections for weight loss    [provider]    Family History Family History  Problem Relation Age of Onset  . Heart disease Mother   . Hypertension Mother   . Heart disease Father   . Hypertension Father   . Diabetes Father   . Heart disease Sister   . Hypertension Sister   . Diabetes Sister   . Heart attack Maternal Grandfather   .  Colon cancer Neg Hx   . Breast cancer Neg Hx   . Mental illness Neg Hx   . Rectal cancer Neg Hx   . Stomach cancer Neg Hx     Social History Social History   Tobacco Use  . Smoking status: Never Smoker  . Smokeless tobacco: Never Used  Vaping Use  . Vaping Use: Never used  Substance Use Topics  . Alcohol use: No  . Drug use: No     Allergies   Aspirin, Biaxin [clarithromycin], Penicillins, Ceftin [cefuroxime axetil], Cortisone, Cortizone-10 [hydrocortisone], Prednisone, and Sulfur   Review of Systems Review of Systems  Constitutional: Negative for chills and fever.  HENT: Positive for congestion and ear pain. Negative for sore throat.   Eyes: Negative for pain and visual disturbance.  Respiratory: Negative for cough and shortness  of breath.   Cardiovascular: Negative for chest pain and palpitations.  Gastrointestinal: Negative for abdominal pain and vomiting.  Genitourinary: Negative for dysuria and hematuria.  Musculoskeletal: Negative for arthralgias and back pain.  Skin: Negative for color change and rash.  Neurological: Negative for seizures and syncope.  All other systems reviewed and are negative.    Physical Exam Triage Vital Signs ED Triage Vitals [11/19/19 1551]  Enc Vitals Group     BP      Pulse      Resp      Temp      Temp src      SpO2      Weight      Height      Head Circumference      Peak Flow      Pain Score 6     Pain Loc      Pain Edu?      Excl. in GC?    No data found.  Updated Vital Signs BP (!) 144/86   Pulse (!) 58   Temp 98.3 F (36.8 C)   Resp 16   LMP  (LMP Unknown)   SpO2 96%   Visual Acuity Right Eye Distance:   Left Eye Distance:   Bilateral Distance:    Right Eye Near:   Left Eye Near:    Bilateral Near:     Physical Exam Vitals and nursing note reviewed.  Constitutional:      General: She is not in acute distress.    Appearance: She is well-developed. She is not ill-appearing.  HENT:     Head: Normocephalic and atraumatic.     Right Ear: Tympanic membrane normal.     Left Ear: Tympanic membrane normal.     Nose: Nose normal.     Mouth/Throat:     Mouth: Mucous membranes are moist.     Pharynx: Oropharynx is clear.  Eyes:     Conjunctiva/sclera: Conjunctivae normal.  Cardiovascular:     Rate and Rhythm: Normal rate and regular rhythm.     Heart sounds: No murmur heard.   Pulmonary:     Effort: Pulmonary effort is normal. No respiratory distress.     Breath sounds: Normal breath sounds.  Abdominal:     Palpations: Abdomen is soft.     Tenderness: There is no abdominal tenderness. There is no guarding or rebound.  Musculoskeletal:     Cervical back: Neck supple.  Skin:    General: Skin is warm and dry.     Findings: No rash.    Neurological:     General: No focal deficit present.     Mental  Status: She is alert and oriented to person, place, and time.     Gait: Gait normal.  Psychiatric:        Mood and Affect: Mood normal.        Behavior: Behavior normal.      UC Treatments / Results  Labs (all labs ordered are listed, but only abnormal results are displayed) Labs Reviewed - No data to display  EKG   Radiology No results found.  Procedures Procedures (including critical care time)  Medications Ordered in UC Medications - No data to display  Initial Impression / Assessment and Plan / UC Course  I have reviewed the triage vital signs and the nursing notes.  Pertinent labs & imaging results that were available during my care of the patient were reviewed by me and considered in my medical decision making (see chart for details).   Otalgia, URI.  Patient declines COVID test today.  Discussed antibiotic stewardship at length.  Instructed her to take Tylenol as needed for her discomfort.  Instructed her to follow-up with her PCP or ENT if her symptoms are not improving.  Agrees to plan of care.   Final Clinical Impressions(s) / UC Diagnoses   Final diagnoses:  Otalgia of both ears  Upper respiratory tract infection, unspecified type     Discharge Instructions     Take Tylenol as needed for discomfort.    Follow up with your primary care provider if your symptoms are not improving.       ED Prescriptions    None     PDMP not reviewed this encounter.   Mickie Bail, NP 11/19/19 [phone]

## 2019-11-19 NOTE — Discharge Instructions (Signed)
Take Tylenol as needed for discomfort.  Follow up with your primary care provider if your symptoms are not improving.    

## 2019-11-19 NOTE — ED Triage Notes (Signed)
Patient reports she has bilateral ear pain and some nasal drainage. States she has six grandchildren, and some of them were sneezing earlier this week. Patient is followed by [facility] but was unable to get an appointment with them today.   Reports that this is what usually happens when she has an ear infection; declines COVID testing.

## 2019-12-09 ENCOUNTER — Other Ambulatory Visit: Payer: Self-pay | Admitting: Internal Medicine

## 2019-12-09 ENCOUNTER — Other Ambulatory Visit: Payer: Self-pay | Admitting: Otolaryngology

## 2019-12-09 ENCOUNTER — Other Ambulatory Visit (HOSPITAL_COMMUNITY): Payer: Self-pay | Admitting: Internal Medicine

## 2019-12-09 DIAGNOSIS — H9041 Sensorineural hearing loss, unilateral, right ear, with unrestricted hearing on the contralateral side: Secondary | ICD-10-CM

## 2019-12-16 ENCOUNTER — Other Ambulatory Visit: Payer: Self-pay

## 2019-12-16 ENCOUNTER — Ambulatory Visit
Admission: EM | Admit: 2019-12-16 | Discharge: 2019-12-16 | Disposition: A | Discharge status: Home / Self Care | Payer: BC Managed Care – PPO | Attending: Family Medicine | Admitting: Family Medicine

## 2019-12-16 DIAGNOSIS — R350 Frequency of micturition: Secondary | ICD-10-CM | POA: Diagnosis not present

## 2019-12-16 DIAGNOSIS — R3915 Urgency of urination: Secondary | ICD-10-CM | POA: Insufficient documentation

## 2019-12-16 DIAGNOSIS — R300 Dysuria: Secondary | ICD-10-CM | POA: Insufficient documentation

## 2019-12-16 LAB — POCT URINALYSIS DIP (MANUAL ENTRY)
Bilirubin, UA: NEGATIVE
Glucose, UA: NEGATIVE mg/dL
Ketones, POC UA: NEGATIVE mg/dL
Leukocytes, UA: NEGATIVE
Nitrite, UA: NEGATIVE
Protein Ur, POC: NEGATIVE mg/dL
Spec Grav, UA: 1.01 (ref 1.010–1.025)
Urobilinogen, UA: 0.2 E.U./dL
pH, UA: 5.5 (ref 5.0–8.0)

## 2019-12-16 MED ORDER — PHENAZOPYRIDINE HCL 200 MG PO TABS: 200.0000 mg | ORAL_TABLET | Freq: Three times a day (TID) | ORAL | 0 refills | Status: DC

## 2019-12-16 NOTE — Discharge Instructions (Signed)
You may have a urinary tract infection.   We are going to culture your urine and will call you as soon as we have the results.   Drink plenty of water, 8-10 glasses per day.   I have sent in pyridium for you to take three times per day as needed for urinary discomfort.  Follow up with your primary care provider as needed.   Go to the Emergency Department if you experience severe pain, shortness of breath, high fever, or other concerns.

## 2019-12-16 NOTE — ED Provider Notes (Signed)
[facility]   CC: UTI  SUBJECTIVE:  Suzanne Donaldson is a 59 y.o. female who complains of urinary frequency, urgency and dysuria for the past 2-3 days. Reports hx UTI in the past. Patient denies a precipitating event, recent sexual encounter, excessive caffeine intake. Localizes the pain to the lower abdomen. Pain is intermittent and describes it as burning. Has tried OTC medications without relief. Symptoms are made worse with urination. Admits to similar symptoms in the past.  Denies fever, chills, nausea, vomiting, flank pain, abnormal vaginal discharge or bleeding, hematuria.    LMP: No LMP recorded (lmp unknown). Patient is postmenopausal.  ROS: As in HPI.  All other pertinent ROS negative.     Past Medical History:  Diagnosis Date  . Allergy   . Bursitis   . Bursitis    right wrist  . Cataract    removed by surgery - bilateral  . Hypercholesteremia   . Hypertension    Past Surgical History:  Procedure Laterality Date  . ANAL FISTULOTOMY    . CARDIAC CATHETERIZATION    . CATARACT EXTRACTION Bilateral   . EXPLORATORY LAPAROTOMY    . EYE SURGERY Left    floaters - laser  . wisdom teeth ext     Allergies  Allergen Reactions  . Aspirin Hives  . Biaxin [Clarithromycin]     Interferes with Cardizem  . Penicillins Hives  . Ceftin [Cefuroxime Axetil] Rash  . Cortisone Other (See Comments)    Pt felt "wierd"  . Cortizone-10 [Hydrocortisone] Rash    Can take this medication by injections but by mouth it causes a rash  . Prednisone Rash  . Sulfa Antibiotics Rash   No current facility-administered medications on file prior to encounter.   Current Outpatient Medications on File Prior to Encounter  Medication Sig Dispense Refill  . atorvastatin (LIPITOR) 80 MG tablet Take 1 tablet by mouth once daily 90 tablet 3  . diltiazem (CARDIZEM CD) 180 MG 24 hr capsule Take 180 mg by mouth daily.    . fluticasone (FLONASE) 50 MCG/ACT nasal spray Place 1 spray into both  nostrils daily. 16 g 2  . furosemide (LASIX) 20 MG tablet Take 20 mg by mouth.    Marland Kitchen lisinopril (ZESTRIL) 20 MG tablet Take 20 mg by mouth daily.    Marland Kitchen loratadine (CLARITIN) 10 MG tablet Take 10 mg by mouth daily.    . naproxen (NAPROSYN) 375 MG tablet Take by mouth.    . nebivolol (BYSTOLIC) 10 MG tablet Take 10 mg by mouth daily.    . nitroGLYCERIN (NITROSTAT) 0.4 MG SL tablet Place 0.4 mg under the tongue every 5 (five) minutes as needed for chest pain.    Marland Kitchen UNABLE TO FIND Allergy injections- 2 injection q weekly given by [facility]    . UNKNOWN TO PATIENT Daily injections for weight loss    . Vitamin D, Ergocalciferol, (DRISDOL) 50000 units CAPS capsule Take 1 capsule by mouth once a week.     Social History   Socioeconomic History  . Marital status: Widowed    Spouse name: Not on file  . Number of children: Not on file  . Years of education: Not on file  . Highest education level: Not on file  Occupational History  . Not on file  Tobacco Use  . Smoking status: Never Smoker  . Smokeless tobacco: Never Used  Vaping Use  . Vaping Use: Never used  Substance and Sexual Activity  . Alcohol use: No  .  Drug use: No  . Sexual activity: Not Currently    Birth control/protection: Post-menopausal  Other Topics Concern  . Not on file  Social History Narrative  . Not on file   Social Determinants of Health   Financial Resource Strain:   . Difficulty of Paying Living Expenses: Not on file  Food Insecurity:   . Worried About Programme researcher, broadcasting/film/video in the Last Year: Not on file  . Ran Out of Food in the Last Year: Not on file  Transportation Needs:   . Lack of Transportation (Medical): Not on file  . Lack of Transportation (Non-Medical): Not on file  Physical Activity:   . Days of Exercise per Week: Not on file  . Minutes of Exercise per Session: Not on file  Stress:   . Feeling of Stress : Not on file  Social Connections:   . Frequency of Communication with Friends and Family:  Not on file  . Frequency of Social Gatherings with Friends and Family: Not on file  . Attends Religious Services: Not on file  . Active Member of Clubs or Organizations: Not on file  . Attends Banker Meetings: Not on file  . Marital Status: Not on file  Intimate Partner Violence:   . Fear of Current or Ex-Partner: Not on file  . Emotionally Abused: Not on file  . Physically Abused: Not on file  . Sexually Abused: Not on file   Family History  Problem Relation Age of Onset  . Heart disease Mother   . Hypertension Mother   . Heart disease Father   . Hypertension Father   . Diabetes Father   . Heart disease Sister   . Hypertension Sister   . Diabetes Sister   . Heart attack Maternal Grandfather   . Colon cancer Neg Hx   . Breast cancer Neg Hx   . Mental illness Neg Hx   . Rectal cancer Neg Hx   . Stomach cancer Neg Hx     OBJECTIVE:  Vitals:   12/16/19 0821  BP: 127/81  Pulse: (!) 53  Resp: 16  Temp: 98.7 F (37.1 C)  TempSrc: Oral  SpO2: 96%   General appearance: AOx3 in no acute distress HEENT: NCAT. Oropharynx clear.  Lungs: clear to auscultation bilaterally without adventitious breath sounds Heart: regular rate and rhythm. Radial pulses 2+ symmetrical bilaterally Abdomen: soft; non-distended; suprapubic tenderness; bowel sounds present; no guarding or rebound tenderness Back: no CVA tenderness Extremities: no edema; symmetrical with no gross deformities Skin: warm and dry Neurologic: Ambulates from chair to exam table without difficulty Psychological: alert and cooperative; normal mood and affect  Labs Reviewed  POCT URINALYSIS DIP (MANUAL ENTRY) - Abnormal; Notable for the following components:      Result Value   Blood, UA trace-intact (*)    All other components within normal limits  URINE CULTURE    ASSESSMENT & PLAN:  1. Dysuria   2. Urinary frequency   3. Urinary urgency     Meds ordered this encounter  Medications  .  phenazopyridine (PYRIDIUM) 200 MG tablet    Sig: Take 1 tablet (200 mg total) by mouth 3 (three) times daily.    Dispense:  6 tablet    Refill:  0    Order Specific Question:   Supervising Provider    Answer:   Merrilee Jansky [id number]    Urine culture sent  We will call you with abnormal results that need further treatment Push fluids  and get plenty of rest Take pyridium as prescribed and as needed for symptomatic relief Follow up with PCP if symptoms persists Return here or go to ER if you have any new or worsening symptoms such as fever, worsening abdominal pain, nausea/vomiting, flank pain  Outlined signs and symptoms indicating need for more acute intervention Patient verbalized understanding After Visit Summary given     Moshe Cipro, NP 12/16/19 [phone]

## 2019-12-16 NOTE — ED Triage Notes (Signed)
Patient presents to Urgent Care with complaints of urinary frequency and burning since about 2-3 days ago. Patient reports she is going out of town tomorrow so would like to get it taken care of, thinks she has a uti.

## 2019-12-18 LAB — URINE CULTURE: Culture: <10000 — AB

## 2019-12-20 ENCOUNTER — Other Ambulatory Visit: Payer: Self-pay

## 2019-12-20 ENCOUNTER — Ambulatory Visit
Admission: EM | Admit: 2019-12-20 | Discharge: 2019-12-20 | Disposition: A | Discharge status: Home / Self Care | Payer: BC Managed Care – PPO | Attending: Family Medicine | Admitting: Family Medicine

## 2019-12-20 ENCOUNTER — Encounter: Payer: Self-pay | Admitting: Emergency Medicine

## 2019-12-20 DIAGNOSIS — R300 Dysuria: Secondary | ICD-10-CM | POA: Diagnosis not present

## 2019-12-20 DIAGNOSIS — R3915 Urgency of urination: Secondary | ICD-10-CM | POA: Insufficient documentation

## 2019-12-20 LAB — POCT URINALYSIS DIP (MANUAL ENTRY)
Bilirubin, UA: NEGATIVE
Glucose, UA: NEGATIVE mg/dL
Ketones, POC UA: NEGATIVE mg/dL
Nitrite, UA: NEGATIVE
Protein Ur, POC: NEGATIVE mg/dL
Spec Grav, UA: 1.015 (ref 1.010–1.025)
Urobilinogen, UA: 0.2 E.U./dL
pH, UA: 7 (ref 5.0–8.0)

## 2019-12-20 MED ORDER — NITROFURANTOIN MONOHYD MACRO 100 MG PO CAPS: 100.0000 mg | ORAL_CAPSULE | Freq: Two times a day (BID) | ORAL | 0 refills | Status: DC

## 2019-12-20 MED ORDER — PHENAZOPYRIDINE HCL 200 MG PO TABS: 200.0000 mg | ORAL_TABLET | Freq: Three times a day (TID) | ORAL | 0 refills | Status: DC

## 2019-12-20 NOTE — ED Triage Notes (Signed)
Patient in office today c/o feels as if she is not complete recovered from UTI.  States that she finished medication b;ut still has some urgency.  KGY:JEHU

## 2019-12-20 NOTE — Discharge Instructions (Addendum)
I have sent in Macrobid for you to take twice a day for 5 days  We have cultured your urine and will be in touch with abnormal results that require further treatment.   Follow up with this office or with primary care if symptoms are persisting.  Follow up in the ER for high fever, trouble swallowing, trouble breathing, other concerning symptoms.

## 2019-12-20 NOTE — ED Provider Notes (Signed)
[facility]   CC: UTI  SUBJECTIVE:  Suzanne Donaldson is a 59 y.o. female who complains of urinary frequency, urgency and dysuria for the past week. Was seen in this office on 12/16/19. Urine culture that day had insignificant growth. Was treated with pyridium. Reports that she went out of town over the weekend and has still been experiencing urgency. Patient denies a precipitating event, recent sexual encounter, excessive caffeine intake. Localizes the pain to the lower abdomen. Pain is intermittent and describes it as sharp/burning. Has tried OTC medications without relief. Symptoms are made worse with urination. Admits to similar symptoms in the past.  Denies fever, chills, nausea, vomiting, abdominal pain, flank pain, abnormal vaginal discharge or bleeding, hematuria.    LMP: No LMP recorded (lmp unknown). Patient is postmenopausal.  ROS: As in HPI.  All other pertinent ROS negative.     Past Medical History:  Diagnosis Date   Allergy    Bursitis    Bursitis    right wrist   Cataract    removed by surgery - bilateral   Hypercholesteremia    Hypertension    Past Surgical History:  Procedure Laterality Date   ANAL FISTULOTOMY     CARDIAC CATHETERIZATION     CATARACT EXTRACTION Bilateral    EXPLORATORY LAPAROTOMY     EYE SURGERY Left    floaters - laser   wisdom teeth ext     Allergies  Allergen Reactions   Aspirin Hives   Biaxin [Clarithromycin]     Interferes with Cardizem   Penicillins Hives   Ceftin [Cefuroxime Axetil] Rash   Cortisone Other (See Comments)    Pt felt "wierd"   Cortizone-10 [Hydrocortisone] Rash    Can take this medication by injections but by mouth it causes a rash   Prednisone Rash   Sulfa Antibiotics Rash   No current facility-administered medications on file prior to encounter.   Current Outpatient Medications on File Prior to Encounter  Medication Sig Dispense Refill   atorvastatin (LIPITOR) 80 MG tablet Take  1 tablet by mouth once daily 90 tablet 3   diltiazem (CARDIZEM CD) 180 MG 24 hr capsule Take 180 mg by mouth daily.     fluticasone (FLONASE) 50 MCG/ACT nasal spray Place 1 spray into both nostrils daily. 16 g 2   furosemide (LASIX) 20 MG tablet Take 20 mg by mouth.     lisinopril (ZESTRIL) 20 MG tablet Take 20 mg by mouth daily.     loratadine (CLARITIN) 10 MG tablet Take 10 mg by mouth daily.     naproxen (NAPROSYN) 375 MG tablet Take by mouth.     nebivolol (BYSTOLIC) 10 MG tablet Take 10 mg by mouth daily.     nitroGLYCERIN (NITROSTAT) 0.4 MG SL tablet Place 0.4 mg under the tongue every 5 (five) minutes as needed for chest pain.     UNABLE TO FIND Allergy injections- 2 injection q weekly given by [facility]     UNKNOWN TO PATIENT Daily injections for weight loss     Vitamin D, Ergocalciferol, (DRISDOL) 50000 units CAPS capsule Take 1 capsule by mouth once a week.     Social History   Socioeconomic History   Marital status: Widowed    Spouse name: Not on file   Number of children: Not on file   Years of education: Not on file   Highest education level: Not on file  Occupational History   Not on file  Tobacco Use   Smoking  status: Never Smoker   Smokeless tobacco: Never Used  Vaping Use   Vaping Use: Never used  Substance and Sexual Activity   Alcohol use: No   Drug use: No   Sexual activity: Not Currently    Birth control/protection: Post-menopausal  Other Topics Concern   Not on file  Social History Narrative   Not on file   Social Determinants of Health   Financial Resource Strain:    Difficulty of Paying Living Expenses: Not on file  Food Insecurity:    Worried About Running Out of Food in the Last Year: Not on file   [employer] of Food in the Last Year: Not on file  Transportation Needs:    Lack of Transportation (Medical): Not on file   Lack of Transportation (Non-Medical): Not on file  Physical Activity:    Days of Exercise per  Week: Not on file   Minutes of Exercise per Session: Not on file  Stress:    Feeling of Stress : Not on file  Social Connections:    Frequency of Communication with Friends and Family: Not on file   Frequency of Social Gatherings with Friends and Family: Not on file   Attends Religious Services: Not on file   Active Member of Clubs or Organizations: Not on file   Attends Banker Meetings: Not on file   Marital Status: Not on file  Intimate Partner Violence:    Fear of Current or Ex-Partner: Not on file   Emotionally Abused: Not on file   Physically Abused: Not on file   Sexually Abused: Not on file   Family History  Problem Relation Age of Onset   Heart disease Mother    Hypertension Mother    Heart disease Father    Hypertension Father    Diabetes Father    Heart disease Sister    Hypertension Sister    Diabetes Sister    Heart attack Maternal Grandfather    Colon cancer Neg Hx    Breast cancer Neg Hx    Mental illness Neg Hx    Rectal cancer Neg Hx    Stomach cancer Neg Hx     OBJECTIVE:  Vitals:   12/20/19 0814 12/20/19 0816  BP:  129/80  Pulse:  (!) 54  Resp:  16  Temp:  98.6 F (37 C)  TempSrc:  Oral  SpO2:  96%  Weight: 170 lb (77.1 kg)    General appearance: AOx3 in no acute distress HEENT: NCAT. Oropharynx clear.  Lungs: clear to auscultation bilaterally without adventitious breath sounds Heart: regular rate and rhythm. Radial pulses 2+ symmetrical bilaterally Abdomen: soft; non-distended; suprapubic tenderness; bowel sounds present; no guarding or rebound tenderness Back: no CVA tenderness Extremities: no edema; symmetrical with no gross deformities Skin: warm and dry Neurologic: Ambulates from chair to exam table without difficulty Psychological: alert and cooperative; normal mood and affect  Labs Reviewed  POCT URINALYSIS DIP (MANUAL ENTRY) - Abnormal; Notable for the following components:      Result Value    Blood, UA trace-intact (*)    Leukocytes, UA Trace (*)    All other components within normal limits  URINE CULTURE    ASSESSMENT & PLAN:  1. Dysuria   2. Urinary urgency     Meds ordered this encounter  Medications   nitrofurantoin, macrocrystal-monohydrate, (MACROBID) 100 MG capsule    Sig: Take 1 capsule (100 mg total) by mouth 2 (two) times daily.    Dispense:  10 capsule    Refill:  0    Order Specific Question:   Supervising Provider    Answer:   Merrilee Jansky [[id number]]   phenazopyridine (PYRIDIUM) 200 MG tablet    Sig: Take 1 tablet (200 mg total) by mouth 3 (three) times daily.    Dispense:  6 tablet    Refill:  0    Order Specific Question:   Supervising Provider    Answer:   Merrilee Jansky [id number]    Urine culture sent  Prescribed Macrobid We will call you with abnormal results that need further treatment Push fluids and get plenty of rest Take antibiotic as directed and to completion Follow up with PCP if symptoms persists Return here or go to ER if you have any new or worsening symptoms such as fever, worsening abdominal pain, nausea/vomiting, flank pain  Outlined signs and symptoms indicating need for more acute intervention Patient verbalized understanding After Visit Summary given     Moshe Cipro, NP 12/20/19 [phone]

## 2019-12-21 LAB — URINE CULTURE: Culture: NO GROWTH

## 2019-12-27 ENCOUNTER — Ambulatory Visit
Admission: RE | Admit: 2019-12-27 | Discharge: 2019-12-27 | Disposition: A | Discharge status: Home / Self Care | Payer: BC Managed Care – PPO | Source: Ambulatory Visit | Attending: Otolaryngology | Admitting: Otolaryngology

## 2019-12-27 ENCOUNTER — Other Ambulatory Visit: Payer: Self-pay

## 2019-12-27 DIAGNOSIS — H9041 Sensorineural hearing loss, unilateral, right ear, with unrestricted hearing on the contralateral side: Secondary | ICD-10-CM | POA: Diagnosis present

## 2019-12-27 MED ORDER — GADOBUTROL 1 MMOL/ML IV SOLN
7.5000 mL | Freq: Once | INTRAVENOUS | Status: AC | PRN
  Administered 2019-12-27: 7.5 mL via INTRAVENOUS

## 2020-02-21 ENCOUNTER — Other Ambulatory Visit: Payer: Self-pay

## 2020-02-21 ENCOUNTER — Ambulatory Visit
Admission: EM | Admit: 2020-02-21 | Discharge: 2020-02-21 | Disposition: A | Discharge status: Home / Self Care | Payer: BC Managed Care – PPO | Attending: Family Medicine | Admitting: Family Medicine

## 2020-02-21 ENCOUNTER — Encounter: Payer: Self-pay | Admitting: Family Medicine

## 2020-02-21 DIAGNOSIS — J0110 Acute frontal sinusitis, unspecified: Secondary | ICD-10-CM

## 2020-02-21 DIAGNOSIS — H00015 Hordeolum externum left lower eyelid: Secondary | ICD-10-CM | POA: Diagnosis not present

## 2020-02-21 MED ORDER — DOXYCYCLINE HYCLATE 100 MG PO CAPS: 100.0000 mg | ORAL_CAPSULE | Freq: Two times a day (BID) | ORAL | 0 refills | Status: AC

## 2020-02-21 NOTE — ED Triage Notes (Signed)
Pt presents with sinus pressure, eye pain/swelling x 3 days.  Started coughing overnight.  +chills.  Mucinex last night with no relief.  Tylenol at 0400 did help her sleep.   Has had COVID vaccines x 2.  Does not want tested for COVID.

## 2020-02-21 NOTE — ED Provider Notes (Addendum)
[name]    CSN: [id number] Arrival date & time: 02/21/20  0809      History   Chief Complaint Chief Complaint  Patient presents with  . Sinusitis    HPI Suzanne Donaldson is a 59 y.o. female.   Patient is a 59 year old female presents today with sinus pressure, eye pain, swelling, cough, chills, mucus production.  Symptoms have been constant and worsening over the past 3 days.  Tylenol last night for the pain.  Has been taking Flonase and her daily allergy medication.  Has been COVID vaccinated.  No concern for Covid at this time.     Past Medical History:  Diagnosis Date  . Allergy   . Bursitis   . Bursitis    right wrist  . Cataract    removed by surgery - bilateral  . Hypercholesteremia   . Hypertension     Patient Active Problem List   Diagnosis Date Noted  . Coronary artery disease involving native coronary artery of native heart without angina pectoris 07/08/2018  . Essential hypertension 07/08/2018  . Pure hypercholesterolemia 07/08/2018    Past Surgical History:  Procedure Laterality Date  . ANAL FISTULOTOMY    . CARDIAC CATHETERIZATION    . CATARACT EXTRACTION Bilateral   . EXPLORATORY LAPAROTOMY    . EYE SURGERY Left    floaters - laser  . wisdom teeth ext      OB History   No obstetric history on file.      Home Medications    Prior to Admission medications   Medication Sig Start Date End Date Taking? Authorizing Provider  atorvastatin (LIPITOR) 80 MG tablet Take 1 tablet by mouth once daily 09/28/19   Jake Bathe, MD  diltiazem (CARDIZEM CD) 180 MG 24 hr capsule Take 180 mg by mouth daily. 09/27/19   [provider]  doxycycline (VIBRAMYCIN) 100 MG capsule Take 1 capsule (100 mg total) by mouth 2 (two) times daily for 7 days. 02/21/20 02/28/20  Dahlia Byes A, NP  fluticasone (FLONASE) 50 MCG/ACT nasal spray Place 1 spray into both nostrils daily. 11/30/17   Georgetta Haber, NP  furosemide (LASIX) 20 MG tablet Take 20 mg  by mouth.    [provider]  lisinopril (ZESTRIL) 20 MG tablet Take 20 mg by mouth daily. 08/20/19   [provider]  loratadine (CLARITIN) 10 MG tablet Take 10 mg by mouth daily.    [provider]  naproxen (NAPROSYN) 375 MG tablet Take by mouth.    [provider]  nitroGLYCERIN (NITROSTAT) 0.4 MG SL tablet Place 0.4 mg under the tongue every 5 (five) minutes as needed for chest pain.    [provider]  UNABLE TO FIND Allergy injections- 2 injection q weekly given by [facility]    [provider]  UNKNOWN TO PATIENT Daily injections for weight loss    [provider]  Vitamin D, Ergocalciferol, (DRISDOL) 50000 units CAPS capsule Take 1 capsule by mouth once a week. 05/07/17   [provider]  nebivolol (BYSTOLIC) 10 MG tablet Take 10 mg by mouth daily.  02/21/20  [provider]    Family History Family History  Problem Relation Age of Onset  . Heart disease Mother   . Hypertension Mother   . Heart disease Father   . Hypertension Father   . Diabetes Father   . Heart disease Sister   . Hypertension Sister   . Diabetes Sister   . Heart  attack Maternal Grandfather   . Colon cancer Neg Hx   . Breast cancer Neg Hx   . Mental illness Neg Hx   . Rectal cancer Neg Hx   . Stomach cancer Neg Hx     Social History Social History   Tobacco Use  . Smoking status: Never Smoker  . Smokeless tobacco: Never Used  Vaping Use  . Vaping Use: Never used  Substance Use Topics  . Alcohol use: No  . Drug use: No     Allergies   Aspirin, Biaxin [clarithromycin], Penicillins, Ceftin [cefuroxime axetil], Cortisone, Cortizone-10 [hydrocortisone], Prednisone, and Sulfa antibiotics   Review of Systems Review of Systems   Physical Exam Triage Vital Signs ED Triage Vitals  Enc Vitals Group     BP 02/21/20 0821 (!) 142/75     Pulse Rate 02/21/20 0821 (!) 56     Resp 02/21/20 0821 18     Temp 02/21/20 0821  98.9 F (37.2 C)     Temp Source 02/21/20 0821 Oral     SpO2 02/21/20 0821 97 %     Weight --      Height --      Head Circumference --      Peak Flow --      Pain Score 02/21/20 0823 0     Pain Loc --      Pain Edu? --      Excl. in GC? --    No data found.  Updated Vital Signs BP (!) 142/75 (BP Location: Left Arm)   Pulse (!) 56   Temp 98.9 F (37.2 C) (Oral)   Resp 18   LMP  (LMP Unknown)   SpO2 97%   Visual Acuity Right Eye Distance:   Left Eye Distance:   Bilateral Distance:    Right Eye Near:   Left Eye Near:    Bilateral Near:     Physical Exam Vitals and nursing note reviewed.  Constitutional:      General: She is not in acute distress.    Appearance: Normal appearance. She is not ill-appearing, toxic-appearing or diaphoretic.  HENT:     Head: Normocephalic.     Right Ear: Tympanic membrane and ear canal normal.     Left Ear: Tympanic membrane and ear canal normal.     Nose: Congestion and rhinorrhea present.     Mouth/Throat:     Pharynx: Oropharynx is clear.  Eyes:     Conjunctiva/sclera: Conjunctivae normal.     Comments: Stye to left lower lid with erythema and lower lid swelling.   Pulmonary:     Effort: Pulmonary effort is normal.  Musculoskeletal:        General: Normal range of motion.     Cervical back: Normal range of motion.  Skin:    General: Skin is warm and dry.     Findings: No rash.  Neurological:     Mental Status: She is alert.  Psychiatric:        Mood and Affect: Mood normal.      UC Treatments / Results  Labs (all labs ordered are listed, but only abnormal results are displayed) Labs Reviewed - No data to display  EKG   Radiology No results found.  Procedures Procedures (including critical care time)  Medications Ordered in UC Medications - No data to display  Initial Impression / Assessment and Plan / UC Course  I have reviewed the triage vital signs and the nursing notes.  Pertinent labs &  imaging  results that were available during my care of the patient were reviewed by me and considered in my medical decision making (see chart for details).     Stye and sinusitis  Warm compresses Flonase and Claritin daily.  Abx as prescribed  Final Clinical Impressions(s) / UC Diagnoses   Final diagnoses:  Hordeolum externum of left lower eyelid  Acute non-recurrent frontal sinusitis     Discharge Instructions     Warm compresses to the eye for 10 min at a time a few times a day. Antibiotics as prescribed Flonase and Claritin daily Follow up as needed for continued or worsening symptoms     ED Prescriptions    Medication Sig Dispense Auth. Provider   doxycycline (VIBRAMYCIN) 100 MG capsule Take 1 capsule (100 mg total) by mouth 2 (two) times daily for 7 days. 14 capsule Gerritt Galentine A, NP     PDMP not reviewed this encounter.   Janace Aris, NP 02/21/20 0102    Janace Aris, NP 02/21/20 0840

## 2020-02-21 NOTE — Discharge Instructions (Addendum)
Warm compresses to the eye for 10 min at a time a few times a day. Antibiotics as prescribed Flonase and Claritin daily Follow up as needed for continued or worsening symptoms

## 2020-04-13 ENCOUNTER — Ambulatory Visit
Admission: EM | Admit: 2020-04-13 | Discharge: 2020-04-13 | Disposition: A | Discharge status: Home / Self Care | Payer: BC Managed Care – PPO

## 2020-04-13 ENCOUNTER — Other Ambulatory Visit: Payer: Self-pay

## 2020-04-13 ENCOUNTER — Encounter: Payer: Self-pay | Admitting: Emergency Medicine

## 2020-04-13 DIAGNOSIS — L509 Urticaria, unspecified: Secondary | ICD-10-CM

## 2020-04-13 MED ORDER — METHYLPREDNISOLONE SODIUM SUCC 125 MG IJ SOLR
80.0000 mg | Freq: Once | INTRAMUSCULAR | Status: AC
  Administered 2020-04-13: 80 mg via INTRAMUSCULAR

## 2020-04-13 NOTE — Discharge Instructions (Signed)
I believe this is an allergy to something.  I have given you a steroid injection here today.  You can continue the benadryl.  Please see your doctor if this continues. If this is a reaction to the generic  medicine you may want to stop it.  You may also want to see your allergist.

## 2020-04-13 NOTE — ED Triage Notes (Signed)
Pt presents today with generalized rash to upper body noted this morning while at at the gym. She took Benadryl at gym with no relief.

## 2020-04-13 NOTE — ED Provider Notes (Signed)
Suzanne Donaldson    CSN: [id number] Arrival date & time: 04/13/20  3354      History   Chief Complaint Chief Complaint  Patient presents with  . Rash    HPI Suzanne Donaldson is a 60 y.o. female.   Pt is a 60 year old female that presents with rash. This started this am. Noticed when she went into the bathroom at the gym. Located to the truck area. Very itchy. No pain. Hx of multiple allergies to medications and environmental triggers. Gets allergy shots. Unsure the cause of this reaction. Started a generic version of Bystolic a few weeks ago. Other than that nothing else is new.  Denies any fever, joint pain. Denies any recent changes in lotions, detergents, foods or other possible irritants. No recent travel. Nobody else at home has the rash. Patient has been outside but denies any contact with plants or insects. No new foods or medications.        Past Medical History:  Diagnosis Date  . Allergy   . Bursitis   . Bursitis    right wrist  . Cataract    removed by surgery - bilateral  . Hypercholesteremia   . Hypertension     Patient Active Problem List   Diagnosis Date Noted  . Coronary artery disease involving native coronary artery of native heart without angina pectoris 07/08/2018  . Essential hypertension 07/08/2018  . Pure hypercholesterolemia 07/08/2018    Past Surgical History:  Procedure Laterality Date  . ANAL FISTULOTOMY    . CARDIAC CATHETERIZATION    . CATARACT EXTRACTION Bilateral   . EXPLORATORY LAPAROTOMY    . EYE SURGERY Left    floaters - laser  . wisdom teeth ext      OB History   No obstetric history on file.      Home Medications    Prior to Admission medications   Medication Sig Start Date End Date Taking? Authorizing Provider  atorvastatin (LIPITOR) 80 MG tablet Take 1 tablet by mouth once daily 09/28/19  Yes Skains, Veverly Fells, MD  diltiazem (CARDIZEM CD) 180 MG 24 hr capsule Take 180 mg by mouth daily. 09/27/19  Yes [provider]  furosemide (LASIX) 20 MG tablet Take 20 mg by mouth.   Yes [provider]  lisinopril (ZESTRIL) 20 MG tablet Take 20 mg by mouth daily. 08/20/19  Yes [provider]  loratadine (CLARITIN) 10 MG tablet Take 10 mg by mouth daily.   Yes [provider]  UNABLE TO FIND Allergy injections- 2 injection q weekly given by [facility]   Yes [provider]  Vitamin D, Ergocalciferol, (DRISDOL) 50000 units CAPS capsule Take 1 capsule by mouth once a week. 05/07/17  Yes [provider]  fluticasone (FLONASE) 50 MCG/ACT nasal spray Place 1 spray into both nostrils daily. 11/30/17   Linus Mako B, NP  naproxen (NAPROSYN) 375 MG tablet Take by mouth.    [provider]  nebivolol (BYSTOLIC) 10 MG tablet Take 10 mg by mouth daily. 03/10/20   [provider]  nitroGLYCERIN (NITROSTAT) 0.4 MG SL tablet Place 0.4 mg under the tongue every 5 (five) minutes as needed for chest pain.    [provider]  UNKNOWN TO PATIENT Daily injections for weight loss    [provider]    Family History Family History  Problem Relation Age of Onset  . Heart disease Mother   . Hypertension Mother   . Heart disease Father   .  Hypertension Father   . Diabetes Father   . Heart disease Sister   . Hypertension Sister   . Diabetes Sister   . Heart attack Maternal Grandfather   . Colon cancer Neg Hx   . Breast cancer Neg Hx   . Mental illness Neg Hx   . Rectal cancer Neg Hx   . Stomach cancer Neg Hx     Social History Social History   Tobacco Use  . Smoking status: Never Smoker  . Smokeless tobacco: Never Used  Vaping Use  . Vaping Use: Never used  Substance Use Topics  . Alcohol use: No  . Drug use: No     Allergies   Aspirin, Biaxin [clarithromycin], Penicillins, Ceftin [cefuroxime axetil], Cortisone, Cortizone-10 [hydrocortisone], Prednisone, and Sulfa antibiotics   Review of Systems Review of  Systems   Physical Exam Triage Vital Signs ED Triage Vitals  Enc Vitals Group     BP 04/13/20 0925 (!) 144/81     Pulse Rate 04/13/20 0925 (!) 53     Resp 04/13/20 0925 18     Temp 04/13/20 0925 98.2 F (36.8 C)     Temp Source 04/13/20 0925 Oral     SpO2 04/13/20 0925 96 %     Weight --      Height --      Head Circumference --      Peak Flow --      Pain Score 04/13/20 0931 0     Pain Loc --      Pain Edu? --      Excl. in GC? --    No data found.  Updated Vital Signs BP (!) 144/81 (BP Location: Left Arm)   Pulse (!) 53   Temp 98.2 F (36.8 C) (Oral)   Resp 18   LMP  (LMP Unknown)   SpO2 96%   Visual Acuity Right Eye Distance:   Left Eye Distance:   Bilateral Distance:    Right Eye Near:   Left Eye Near:    Bilateral Near:     Physical Exam Vitals and nursing note reviewed.  Constitutional:      General: She is not in acute distress.    Appearance: Normal appearance. She is not ill-appearing, toxic-appearing or diaphoretic.  HENT:     Head: Normocephalic.     Nose: Nose normal.  Eyes:     Conjunctiva/sclera: Conjunctivae normal.  Pulmonary:     Effort: Pulmonary effort is normal.  Musculoskeletal:        General: Normal range of motion.     Cervical back: Normal range of motion.  Skin:    General: Skin is warm and dry.     Findings: Rash present.     Comments: Urticarial rash to trunk  Neurological:     Mental Status: She is alert.  Psychiatric:        Mood and Affect: Mood normal.      UC Treatments / Results  Labs (all labs ordered are listed, but only abnormal results are displayed) Labs Reviewed - No data to display  EKG   Radiology No results found.  Procedures Procedures (including critical care time)  Medications Ordered in UC Medications  methylPREDNISolone sodium succinate (SOLU-MEDROL) 125 mg/2 mL injection 80 mg (80 mg Intramuscular Given 04/13/20 1000)    Initial Impression / Assessment and Plan / UC Course  I have  reviewed the triage vital signs and the nursing notes.  Pertinent labs & imaging results that were  available during my care of the patient were reviewed by me and considered in my medical decision making (see chart for details).     Urticaria Most likely allergic reaction to something, unsure of cause Could be the new medication change.  Will treat with steroid injection here and have her continue the benadryl as needed.  Recommend follow up with her doctor if this continues or see her allergist.   Final Clinical Impressions(s) / UC Diagnoses   Final diagnoses:  Urticaria     Discharge Instructions     I believe this is an allergy to something.  I have given you a steroid injection here today.  You can continue the benadryl.  Please see your doctor if this continues. If this is a reaction to the generic  medicine you may want to stop it.  You may also want to see your allergist.     ED Prescriptions    None     PDMP not reviewed this encounter.   Dahlia Byes A, NP 04/13/20 1001

## 2020-07-20 ENCOUNTER — Ambulatory Visit
Admission: EM | Admit: 2020-07-20 | Discharge: 2020-07-20 | Disposition: A | Discharge status: Home / Self Care | Payer: BC Managed Care – PPO | Attending: Emergency Medicine | Admitting: Emergency Medicine

## 2020-07-20 ENCOUNTER — Other Ambulatory Visit: Payer: Self-pay

## 2020-07-20 DIAGNOSIS — I10 Essential (primary) hypertension: Secondary | ICD-10-CM | POA: Diagnosis not present

## 2020-07-20 DIAGNOSIS — J069 Acute upper respiratory infection, unspecified: Secondary | ICD-10-CM

## 2020-07-20 DIAGNOSIS — H6691 Otitis media, unspecified, right ear: Secondary | ICD-10-CM | POA: Diagnosis not present

## 2020-07-20 DIAGNOSIS — Z1152 Encounter for screening for COVID-19: Secondary | ICD-10-CM | POA: Diagnosis not present

## 2020-07-20 MED ORDER — DOXYCYCLINE HYCLATE 100 MG PO CAPS: 100.0000 mg | ORAL_CAPSULE | Freq: Two times a day (BID) | ORAL | 0 refills | Status: DC

## 2020-07-20 NOTE — ED Triage Notes (Signed)
Pt reports having nasal congestion, runny eyes and cough x2 days. sts this morning her R ear feels full. Denies having fever.

## 2020-07-20 NOTE — ED Provider Notes (Signed)
[name]    CSN: [id number] Arrival date & time: 07/20/20  7425      History   Chief Complaint Chief Complaint  Patient presents with  . Nasal Congestion    HPI Suzanne Donaldson is a 60 y.o. female.   Patient presents with 2-day history of postnasal drip, runny nose, nasal congestion, cough.  She states she woke up this morning with "full" feeling in her right ear.  She denies fever, ear pain, sore throat, shortness of breath, or other symptoms.  Treatment attempted at home with Mucinex.  Patient wears hearing aids and states she has a history of frequent ear infections; she is followed by ENT but was unable to get an appointment today; she has an appointment with ENT in July.  Her medical history includes seasonal allergies, hypertension, CAD, hypercholesterolemia.  The history is provided by the patient and medical records.    Past Medical History:  Diagnosis Date  . Allergy   . Bursitis   . Bursitis    right wrist  . Cataract    removed by surgery - bilateral  . Hypercholesteremia   . Hypertension     Patient Active Problem List   Diagnosis Date Noted  . Coronary artery disease involving native coronary artery of native heart without angina pectoris 07/08/2018  . Essential hypertension 07/08/2018  . Pure hypercholesterolemia 07/08/2018    Past Surgical History:  Procedure Laterality Date  . ANAL FISTULOTOMY    . CARDIAC CATHETERIZATION    . CATARACT EXTRACTION Bilateral   . EXPLORATORY LAPAROTOMY    . EYE SURGERY Left    floaters - laser  . wisdom teeth ext      OB History   No obstetric history on file.      Home Medications    Prior to Admission medications   Medication Sig Start Date End Date Taking? Authorizing Provider  doxycycline (VIBRAMYCIN) 100 MG capsule Take 1 capsule (100 mg total) by mouth 2 (two) times daily. 07/20/20  Yes Mickie Bail, NP  atorvastatin (LIPITOR) 80 MG tablet Take 1 tablet by mouth once daily 09/28/19   Jake Bathe, MD  diltiazem (CARDIZEM CD) 180 MG 24 hr capsule Take 180 mg by mouth daily. 09/27/19   [provider]  fluticasone (FLONASE) 50 MCG/ACT nasal spray Place 1 spray into both nostrils daily. 11/30/17   Georgetta Haber, NP  furosemide (LASIX) 20 MG tablet Take 20 mg by mouth.    [provider]  lisinopril (ZESTRIL) 20 MG tablet Take 20 mg by mouth daily. 08/20/19   [provider]  loratadine (CLARITIN) 10 MG tablet Take 10 mg by mouth daily.    [provider]  naproxen (NAPROSYN) 375 MG tablet Take by mouth.    [provider]  nebivolol (BYSTOLIC) 10 MG tablet Take 10 mg by mouth daily. 03/10/20   [provider]  nitroGLYCERIN (NITROSTAT) 0.4 MG SL tablet Place 0.4 mg under the tongue every 5 (five) minutes as needed for chest pain.    [provider]  UNABLE TO FIND Allergy injections- 2 injection q weekly given by [facility]    [provider]  UNKNOWN TO PATIENT Daily injections for weight loss    [provider]  Vitamin D, Ergocalciferol, (DRISDOL) 50000 units CAPS capsule Take 1 capsule by mouth once a week. 05/07/17   [provider]    Family History Family History  Problem Relation Age of Onset  .  Heart disease Mother   . Hypertension Mother   . Heart disease Father   . Hypertension Father   . Diabetes Father   . Heart disease Sister   . Hypertension Sister   . Diabetes Sister   . Heart attack Maternal Grandfather   . Colon cancer Neg Hx   . Breast cancer Neg Hx   . Mental illness Neg Hx   . Rectal cancer Neg Hx   . Stomach cancer Neg Hx     Social History Social History   Tobacco Use  . Smoking status: Never Smoker  . Smokeless tobacco: Never Used  Vaping Use  . Vaping Use: Never used  Substance Use Topics  . Alcohol use: No  . Drug use: No     Allergies   Aspirin, Biaxin [clarithromycin], Penicillins, Ceftin [cefuroxime axetil], Cortisone, Cortizone-10  [hydrocortisone], Prednisone, and Sulfa antibiotics   Review of Systems Review of Systems  Constitutional: Negative for chills and fever.  HENT: Positive for congestion, hearing loss, postnasal drip and rhinorrhea. Negative for ear discharge, ear pain and sore throat.   Respiratory: Positive for cough. Negative for shortness of breath.   Cardiovascular: Negative for chest pain and palpitations.  Gastrointestinal: Negative for abdominal pain, diarrhea and vomiting.  Skin: Negative for color change and rash.  All other systems reviewed and are negative.    Physical Exam Triage Vital Signs ED Triage Vitals  Enc Vitals Group     BP      Pulse      Resp      Temp      Temp src      SpO2      Weight      Height      Head Circumference      Peak Flow      Pain Score      Pain Loc      Pain Edu?      Excl. in GC?    No data found.  Updated Vital Signs BP (!) 153/62   Pulse (!) 58   Temp 98.1 F (36.7 C) (Oral)   Resp 16   Ht 5\' 4"  (1.626 m)   Wt 160 lb (72.6 kg)   LMP  (LMP Unknown)   SpO2 96%   BMI 27.46 kg/m   Visual Acuity Right Eye Distance:   Left Eye Distance:   Bilateral Distance:    Right Eye Near:   Left Eye Near:    Bilateral Near:     Physical Exam Vitals and nursing note reviewed.  Constitutional:      General: She is not in acute distress.    Appearance: She is well-developed.  HENT:     Head: Normocephalic and atraumatic.     Right Ear: Ear canal normal. Tympanic membrane is erythematous and bulging.     Left Ear: Tympanic membrane and ear canal normal. Tympanic membrane is not erythematous.     Nose: Rhinorrhea present.     Mouth/Throat:     Mouth: Mucous membranes are moist.     Pharynx: Oropharynx is clear.  Eyes:     Conjunctiva/sclera: Conjunctivae normal.  Cardiovascular:     Rate and Rhythm: Normal rate and regular rhythm.     Heart sounds: Normal heart sounds.  Pulmonary:     Effort: Pulmonary effort is normal. No respiratory  distress.     Breath sounds: Normal breath sounds.  Abdominal:     Palpations: Abdomen is soft.  Tenderness: There is no abdominal tenderness.  Musculoskeletal:     Cervical back: Neck supple.  Skin:    General: Skin is warm and dry.  Neurological:     General: No focal deficit present.     Mental Status: She is alert and oriented to person, place, and time.  Psychiatric:        Mood and Affect: Mood normal.        Behavior: Behavior normal.      UC Treatments / Results  Labs (all labs ordered are listed, but only abnormal results are displayed) Labs Reviewed  NOVEL CORONAVIRUS, NAA    EKG   Radiology No results found.  Procedures Procedures (including critical care time)  Medications Ordered in UC Medications - No data to display  Initial Impression / Assessment and Plan / UC Course  I have reviewed the triage vital signs and the nursing notes.  Pertinent labs & imaging results that were available during my care of the patient were reviewed by me and considered in my medical decision making (see chart for details).   Right otitis media, viral URI.  Elevated blood pressure reading with known hypertension. Treating with doxycycline; patient urgent to multiple antibiotics/medications.  Discussed other symptomatic treatment including Tylenol and plain over-the-counter Mucinex.  Instructed patient to follow-up with her ENT as scheduled in July; sooner if her symptoms are not improving. Discussed that her blood pressure is elevated today and needs to be rechecked by her PCP in 2 to 4 weeks.  Education provided on managing hypertension. Patient agrees to plan of care.   Final Clinical Impressions(s) / UC Diagnoses   Final diagnoses:  Encounter for screening for COVID-19  Right otitis media, unspecified otitis media type  Viral URI  Elevated blood pressure reading in office with diagnosis of hypertension     Discharge Instructions     Take the doxycycline as  directed.  Take plain over-the-counter Mucinex as directed.  Take Tylenol as needed for discomfort.  Follow-up with your ENT.  Your blood pressure is elevated today at 153/62.  Please have this rechecked by your primary care provider in 2-4 weeks.         ED Prescriptions    Medication Sig Dispense Auth. Provider   doxycycline (VIBRAMYCIN) 100 MG capsule Take 1 capsule (100 mg total) by mouth 2 (two) times daily. 20 capsule Mickie Bail, NP     PDMP not reviewed this encounter.   Mickie Bail, NP 07/20/20 [phone]

## 2020-07-20 NOTE — Discharge Instructions (Addendum)
Take the doxycycline as directed.  Take plain over-the-counter Mucinex as directed.  Take Tylenol as needed for discomfort.  Follow-up with your ENT.  Your blood pressure is elevated today at 153/62.  Please have this rechecked by your primary care provider in 2-4 weeks.

## 2020-07-21 LAB — NOVEL CORONAVIRUS, NAA: SARS-CoV-2, NAA: NOT DETECTED

## 2020-07-21 LAB — SARS-COV-2, NAA 2 DAY TAT

## 2020-09-26 ENCOUNTER — Other Ambulatory Visit: Payer: Self-pay

## 2020-09-26 ENCOUNTER — Ambulatory Visit (INDEPENDENT_AMBULATORY_CARE_PROVIDER_SITE_OTHER): Payer: BC Managed Care – PPO

## 2020-09-26 ENCOUNTER — Ambulatory Visit
Admission: EM | Admit: 2020-09-26 | Discharge: 2020-09-26 | Disposition: A | Discharge status: Home / Self Care | Payer: BC Managed Care – PPO | Attending: Physician Assistant | Admitting: Physician Assistant

## 2020-09-26 DIAGNOSIS — S61215A Laceration without foreign body of left ring finger without damage to nail, initial encounter: Secondary | ICD-10-CM

## 2020-09-26 DIAGNOSIS — R0789 Other chest pain: Secondary | ICD-10-CM

## 2020-09-26 MED ORDER — DOXYCYCLINE HYCLATE 100 MG PO CAPS: 100.0000 mg | ORAL_CAPSULE | Freq: Two times a day (BID) | ORAL | 0 refills | Status: AC
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
## 2020-10-03 ENCOUNTER — Other Ambulatory Visit: Payer: Self-pay

## 2020-10-03 ENCOUNTER — Ambulatory Visit
Admission: EM | Admit: 2020-10-03 | Discharge: 2020-10-03 | Disposition: A | Discharge status: Home / Self Care | Payer: BC Managed Care – PPO

## 2020-10-03 DIAGNOSIS — Z4802 Encounter for removal of sutures: Secondary | ICD-10-CM

## 2020-10-03 NOTE — ED Triage Notes (Signed)
Left ring finger suture removal needed. Has been in 7 days. No signs of infection. Clean, dry and intact.

## 2020-10-30 ENCOUNTER — Other Ambulatory Visit: Payer: Self-pay | Admitting: Cardiology

## 2020-12-11 ENCOUNTER — Other Ambulatory Visit: Payer: Self-pay | Admitting: Cardiology

## 2021-01-22 ENCOUNTER — Other Ambulatory Visit: Payer: Self-pay | Admitting: Cardiology

## 2021-04-16 NOTE — Progress Notes (Signed)
Cardiology Office Note:    Date:  04/17/2021   ID:  Barbee Cough, DOB Oct 06, 1960, MRN [id number]  PCP:  Narda Rutherford, MD  [facility] Cardiologist:  Candee Furbish, MD     Referring MD: Narda Rutherford, *   Chief Complaint:  f/u for CAD    Patient Profile: Coronary artery disease  Non-obstructive by cath in 2009 Myoview 4/19: low risk  Echocardiogram 4/19: EF 60-65, Gr 1 DD, mild MR  Allergic to ASA  FHx of CAD Hyperlipidemia  Intol of Rosuvastatin Hypertension   Prior CV Studies: Myoview 06/04/17 EF 62, normal perfusion, low risk  Echocardiogram 06/04/17 Mild LVH, EF 60-65, no RWMA, Gr 1 DD, mild MR  Cardiac catheterization 04/23/07 ([facility]) EF 55 RCA patent LM dist 25 LAD ostial 25, prox to mid 25-40, dist 25-40; Dx osital 27 (med Rx) LCx prox 25 RI w inf subbranch 40, 50    History of Present Illness:   Suzanne Donaldson is a 61 y.o. female with the above problem list.  She was last seen in 8/21 by Dr. Marlou Porch.  She returns for f/u.  She is here alone.  She is doing well without chest pain, shortness of breath, syncope, orthopnea, leg edema.  She does have some increased edema in her legs if she has prolonged standing or walking.  However, this resolves with elevation.  She exercises 3 times a week through [employer] without difficulty.        Past Medical History:  Diagnosis Date   Allergy    Bursitis    Bursitis    right wrist   Cataract    removed by surgery - bilateral   Hypercholesteremia    Hypertension    Current Medications: Current Meds  Medication Sig   chlorthalidone (HYGROTON) 25 MG tablet Take 12.5 mg by mouth daily.   diltiazem (CARDIZEM CD) 180 MG 24 hr capsule Take 180 mg by mouth daily.   furosemide (LASIX) 20 MG tablet Take 20 mg by mouth.   lisinopril (ZESTRIL) 20 MG tablet Take 20 mg by mouth daily.   loratadine (CLARITIN) 10 MG tablet Take 10 mg by mouth daily.   nebivolol (BYSTOLIC) 10 MG tablet Take 10 mg  by mouth daily.   nitroGLYCERIN (NITROSTAT) 0.4 MG SL tablet Place 0.4 mg under the tongue every 5 (five) minutes as needed for chest pain.   topiramate (TOPAMAX) 50 MG tablet Take 50 mg by mouth daily.   UNABLE TO FIND Allergy injections- 2 injection q weekly given by [facility]   Vitamin D, Ergocalciferol, (DRISDOL) 50000 units CAPS capsule Take 1 capsule by mouth once a week.   [DISCONTINUED] atorvastatin (LIPITOR) 80 MG tablet TAKE 1 TABLET BY MOUTH ONCE DAILY . APPOINTMENT REQUIRED FOR FUTURE REFILLS    Allergies:   Aspirin, Biaxin [clarithromycin], Penicillins, Ceftin [cefuroxime axetil], Cortisone, Cortizone-10 [hydrocortisone], Prednisone, and Sulfa antibiotics   Social History   Tobacco Use   Smoking status: Never   Smokeless tobacco: Never  Vaping Use   Vaping Use: Never used  Substance Use Topics   Alcohol use: No   Drug use: No    Family Hx: The patient's family history includes Diabetes in her father and sister; Heart attack in her maternal grandfather; Heart disease in her father, mother, and sister; Hypertension in her father, mother, and sister. There is no history of Colon cancer, Breast cancer, Mental illness, Rectal cancer, or Stomach cancer.  ROS   EKGs/Labs/Other Test Reviewed:  EKG:  EKG is   ordered today.  The ekg ordered today demonstrates NSR, HR 63, normal axis, no ST-T wave changes, QTc 433  Recent Labs: No results found for requested labs within last 8760 hours.   Recent Lipid Panel No results for input(s): CHOL, TRIG, HDL, VLDL, LDLCALC, LDLDIRECT in the last 8760 hours.   Risk Assessment/Calculations:         Physical Exam:    VS:  BP 130/78 (BP Location: Left Arm, Patient Position: Sitting, Cuff Size: Normal)    Pulse 63    Ht 5\' 4"  (1.626 m)    Wt 153 lb (69.4 kg)    LMP  (LMP Unknown)    SpO2 96%    BMI 26.26 kg/m     Wt Readings from Last 3 Encounters:  04/17/21 153 lb (69.4 kg)  07/20/20 160 lb (72.6 kg)  12/20/19 170 lb (77.1 kg)     Physical Exam      ASSESSMENT & PLAN:   Coronary artery disease involving native coronary artery of native heart without angina pectoris Nonobstructive CAD by cardiac catheterization in 2009.  Myoview in 2019 was low risk.  She is doing well without anginal symptoms.  Her electrocardiogram demonstrates no change.  She has never required PCI or had an ACS.  She is allergic to aspirin.  Continue atorvastatin 80 mg daily.  Follow-up in 1 year.  Essential hypertension Blood pressure is well controlled.  Continue diltiazem 180 mg daily, furosemide 20 mg daily, lisinopril 20 mg daily, nebivolol 10 mg daily.  Labs through care everywhere personally reviewed and interpreted.  On 10/10/2020: K+ 3.7, creatinine 0.72, ALT 20.  Pure hypercholesterolemia Labs through Care Everywhere personally reviewed and interpreted.  On 10/10/2020: Triglycerides 132, total cholesterol 145, HDL 51, LDL 68.  LDL is optimal.  Continue atorvastatin 80 mg daily.           Dispo:  Return in about 1 year (around 04/17/2022) for Routine follow up in 1 year with Dr.Skains. .   Medication Adjustments/Labs and Tests Ordered: Current medicines are reviewed at length with the patient today.  Concerns regarding medicines are outlined above.  Tests Ordered: Orders Placed This Encounter  Procedures   EKG 12-Lead   Medication Changes: Meds ordered this encounter  Medications   atorvastatin (LIPITOR) 80 MG tablet    Sig: Take 1 tablet (80 mg total) by mouth daily.    Dispense:  30 tablet    Refill:  [address] [place], [place]  04/17/2021 9:08 AM    [facility] [place], [place], [place]  [address] Phone: [phone]; Fax: [phone]

## 2021-04-17 ENCOUNTER — Encounter: Payer: Self-pay | Admitting: Physician Assistant

## 2021-04-17 ENCOUNTER — Other Ambulatory Visit: Payer: Self-pay

## 2021-04-17 ENCOUNTER — Ambulatory Visit: Payer: BC Managed Care – PPO | Admitting: Physician Assistant

## 2021-04-17 VITALS — BP 130/78 | HR 63 | Ht 64.0 in | Wt 153.0 lb

## 2021-04-17 DIAGNOSIS — I2510 Atherosclerotic heart disease of native coronary artery without angina pectoris: Secondary | ICD-10-CM

## 2021-04-17 DIAGNOSIS — E7800 Pure hypercholesterolemia, unspecified: Secondary | ICD-10-CM

## 2021-04-17 DIAGNOSIS — I10 Essential (primary) hypertension: Secondary | ICD-10-CM | POA: Diagnosis not present

## 2021-04-17 MED ORDER — ATORVASTATIN CALCIUM 80 MG PO TABS: 80.0000 mg | ORAL_TABLET | Freq: Every day | ORAL | 11 refills | Status: DC

## 2021-04-17 NOTE — Assessment & Plan Note (Signed)
Nonobstructive CAD by cardiac catheterization in 2009.  Myoview in 2019 was low risk.  She is doing well without anginal symptoms.  Her electrocardiogram demonstrates no change.  She has never required PCI or had an ACS.  She is allergic to aspirin.  Continue atorvastatin 80 mg daily.  Follow-up in 1 year.

## 2021-04-17 NOTE — Assessment & Plan Note (Signed)
Blood pressure is well controlled.  Continue diltiazem 180 mg daily, furosemide 20 mg daily, lisinopril 20 mg daily, nebivolol 10 mg daily.  Labs through care everywhere personally reviewed and interpreted.  On 10/10/2020: K+ 3.7, creatinine 0.72, ALT 20.

## 2021-04-17 NOTE — Patient Instructions (Signed)
Medication Instructions:  ° °Your physician recommends that you continue on your current medications as directed. Please refer to the Current Medication list given to you today. ° ° °*If you need a refill on your cardiac medications before your next appointment, please call your pharmacy* ° ° °Follow-Up: °At [facility], you and your health needs are our priority.  As part of our continuing mission to provide you with exceptional heart care, we have created designated Provider Care Teams.  These Care Teams include your primary Cardiologist (physician) and Advanced Practice Providers (APPs -  Physician Assistants and Nurse Practitioners) who all work together to provide you with the care you need, when you need it. ° °We recommend signing up for the patient portal called "MyChart".  Sign up information is provided on this After Visit Summary.  MyChart is used to connect with patients for Virtual Visits (Telemedicine).  Patients are able to view lab/test results, encounter notes, upcoming appointments, etc.  Non-urgent messages can be sent to your provider as well.   °To learn more about what you can do with MyChart, go to [email or web address]://www.mychart.com.   ° °Your next appointment:   °1 year(s) ° °The format for your next appointment:   °In Person ° °Provider:   °Mark Skains, MD   ° ° °Other Instructions ° °Your physician wants you to follow-up in: 1 year with Dr. Skains.  You will receive a reminder letter in the mail two months in advance. If you don't receive a letter, please call our office to schedule the follow-up appointment. °  °

## 2021-04-17 NOTE — Assessment & Plan Note (Signed)
Labs through Care Everywhere personally reviewed and interpreted.  On 10/10/2020: Triglycerides 132, total cholesterol 145, HDL 51, LDL 68.  LDL is optimal.  Continue atorvastatin 80 mg daily.

## 2021-05-22 ENCOUNTER — Other Ambulatory Visit: Payer: Self-pay | Admitting: Family Medicine

## 2021-05-22 DIAGNOSIS — Z1231 Encounter for screening mammogram for malignant neoplasm of breast: Secondary | ICD-10-CM

## 2021-07-02 ENCOUNTER — Ambulatory Visit
Admission: RE | Admit: 2021-07-02 | Discharge: 2021-07-02 | Disposition: A | Discharge status: Home / Self Care | Payer: BC Managed Care – PPO | Source: Ambulatory Visit | Attending: Family Medicine | Admitting: Family Medicine

## 2021-07-02 DIAGNOSIS — Z1231 Encounter for screening mammogram for malignant neoplasm of breast: Secondary | ICD-10-CM | POA: Diagnosis present

## 2021-11-14 ENCOUNTER — Telehealth: Payer: Self-pay | Admitting: *Deleted

## 2021-11-14 NOTE — Telephone Encounter (Signed)
   Pre-operative Risk Assessment    Patient Name: Suzanne Donaldson  DOB: 1960/07/31 MRN: [id number]      Request for Surgical Clearance    Procedure:   LEFT THA ANTERIOR HIP  Date of Surgery:  Clearance 01/09/22                                 Surgeon:  Kurtis Bushman, MD Surgeon's Group or Practice Name:  [facility] Phone number:  [phone] Fax number:  [phone]   Type of Clearance Requested:   - Medical    Type of Anesthesia:   LOCAL/SPINAL   Additional requests/questions:    Astrid Divine   11/14/2021, 11:49 AM

## 2021-11-19 ENCOUNTER — Telehealth: Payer: Self-pay

## 2021-11-19 NOTE — Telephone Encounter (Signed)
  Patient Consent for Virtual Visit        Suzanne Donaldson has provided verbal consent on 11/19/2021 for a virtual visit (video or telephone).   CONSENT FOR VIRTUAL VISIT FOR:  Suzanne Donaldson  By participating in this virtual visit I agree to the following:  I hereby voluntarily request, consent and authorize [facility] and its employed or contracted physicians, physician assistants, nurse practitioners or other licensed health care professionals (the Practitioner), to provide me with telemedicine health care services (the "Services") as deemed necessary by the treating Practitioner. I acknowledge and consent to receive the Services by the Practitioner via telemedicine. I understand that the telemedicine visit will involve communicating with the Practitioner through live audiovisual communication technology and the disclosure of certain medical information by electronic transmission. I acknowledge that I have been given the opportunity to request an in-person assessment or other available alternative prior to the telemedicine visit and am voluntarily participating in the telemedicine visit.  I understand that I have the right to withhold or withdraw my consent to the use of telemedicine in the course of my care at any time, without affecting my right to future care or treatment, and that the Practitioner or I may terminate the telemedicine visit at any time. I understand that I have the right to inspect all information obtained and/or recorded in the course of the telemedicine visit and may receive copies of available information for a reasonable fee.  I understand that some of the potential risks of receiving the Services via telemedicine include:  Delay or interruption in medical evaluation due to technological equipment failure or disruption; Information transmitted may not be sufficient (e.g. poor resolution of images) to allow for appropriate medical decision making by the Practitioner;  and/or  In rare instances, security protocols could fail, causing a breach of personal health information.  Furthermore, I acknowledge that it is my responsibility to provide information about my medical history, conditions and care that is complete and accurate to the best of my ability. I acknowledge that Practitioner's advice, recommendations, and/or decision may be based on factors not within their control, such as incomplete or inaccurate data provided by me or distortions of diagnostic images or specimens that may result from electronic transmissions. I understand that the practice of medicine is not an exact science and that Practitioner makes no warranties or guarantees regarding treatment outcomes. I acknowledge that a copy of this consent can be made available to me via my patient portal ([facility]), or I can request a printed copy by calling the office of [facility].    I understand that my insurance will be billed for this visit.   I have read or had this consent read to me. I understand the contents of this consent, which adequately explains the benefits and risks of the Services being provided via telemedicine.  I have been provided ample opportunity to ask questions regarding this consent and the Services and have had my questions answered to my satisfaction. I give my informed consent for the services to be provided through the use of telemedicine in my medical care

## 2021-11-19 NOTE — Telephone Encounter (Signed)
   Name: Suzanne Donaldson  DOB: 03/07/1960  MRN: [id number]  Primary Cardiologist: Candee Furbish, MD   Preoperative team, please contact this patient and set up a phone call appointment for further preoperative risk assessment. Please obtain consent and complete medication review. Thank you for your help.  I confirm that guidance regarding antiplatelet and oral anticoagulation therapy has been completed and, if necessary, noted below (none requested).    [name], NP 11/19/2021, 1:06 PM [place]

## 2021-11-19 NOTE — Telephone Encounter (Signed)
Spoke with patient who is agreeable to do  a tele visit on 11/2 at 9:20 am. Consent has been done but med rec needs to be completed.

## 2021-12-27 ENCOUNTER — Ambulatory Visit: Payer: BC Managed Care – PPO | Attending: Cardiology | Admitting: Nurse Practitioner

## 2021-12-27 DIAGNOSIS — Z01810 Encounter for preprocedural cardiovascular examination: Secondary | ICD-10-CM

## 2021-12-27 NOTE — Progress Notes (Signed)
Virtual Visit via Telephone Note   Because of Suzanne Donaldson's co-morbid illnesses, she is at least at moderate risk for complications without adequate follow up.  This format is felt to be most appropriate for this patient at this time.  The patient did not have access to video technology/had technical difficulties with video requiring transitioning to audio format only (telephone).  All issues noted in this document were discussed and addressed.  No physical exam could be performed with this format.  Please refer to the patient's chart for her consent to telehealth for [facility].  Evaluation Performed:  Preoperative cardiovascular risk assessment _____________   Date:  12/27/2021   Patient ID:  Suzanne Donaldson, DOB [birth year], MRN [id number] Patient Location:  Home Provider location:   Office  Primary Care Provider:  Britt Bottom, MD Primary Cardiologist:  Donato Schultz, MD  Chief Complaint / Patient Profile   61 y.o. y/o female with a h/o CAD, family history of CAD, HLD, and HTN who is pending left THA anterior hip and presents today for telephonic preoperative cardiovascular risk assessment.  Past Medical History    Past Medical History:  Diagnosis Date   Allergy    Bursitis    Bursitis    right wrist   Cataract    removed by surgery - bilateral   Hypercholesteremia    Hypertension    Past Surgical History:  Procedure Laterality Date   ANAL FISTULOTOMY     CARDIAC CATHETERIZATION     CATARACT EXTRACTION Bilateral    EXPLORATORY LAPAROTOMY     EYE SURGERY Left    floaters - laser   wisdom teeth ext      Allergies  Allergies  Allergen Reactions   Aspirin Hives   Biaxin [Clarithromycin]     Interferes with Cardizem   Penicillins Hives   Ceftin [Cefuroxime Axetil] Rash   Cortisone Other (See Comments)    Pt felt "wierd"   Cortizone-10 [Hydrocortisone] Rash    Can take this medication by injections but by mouth it causes a rash    Prednisone Rash   Sulfa Antibiotics Rash    History of Present Illness    Suzanne Donaldson is a 61 y.o. female who presents via audio/video conferencing for a telehealth visit today.  Pt was last seen in cardiology clinic on 04/17/2021 by Tereso Newcomer, PA-C. At that time Haydee Jabbour was doing well. The patient is now pending procedure as outlined above.  Date of surgery is scheduled for January 09, 2022.  Surgeon will be Dr. Cassell Smiles of [facility].  Our office has been contacted for medical clearance.  Type of anesthesia will be local/spinal.  Since her last visit, she has been doing very well.  She says prior to her hip problem, she was very active and was involved in [employer].  She denies any cardiac concerns or complaints today.  She denies any chest pain, shortness of breath, palpitations, syncope, presyncope, dizziness, orthopnea, PND, swelling, significant weight changes, bleeding, or claudication.  Denies any other questions or concerns today.  Home Medications    Prior to Admission medications   Medication Sig Start Date End Date Taking? Authorizing Provider  atorvastatin (LIPITOR) 80 MG tablet Take 1 tablet (80 mg total) by mouth daily. 04/17/21 04/17/22  Tereso Newcomer T, PA-C  chlorthalidone (HYGROTON) 25 MG tablet Take 12.5 mg by mouth daily.    [provider]  diltiazem (CARDIZEM CD) 180 MG 24 hr capsule Take 180 mg by mouth  daily. 09/27/19   [provider]  furosemide (LASIX) 20 MG tablet Take 20 mg by mouth.    [provider]  lisinopril (ZESTRIL) 20 MG tablet Take 20 mg by mouth daily. 08/20/19   [provider]  loratadine (CLARITIN) 10 MG tablet Take 10 mg by mouth daily.    [provider]  Metaxalone (Skelaxin) 800 mg tablet  Take 1 tablet (800 mg) by mouth 3 (three) times daily as needed for muscle spasms or for pain.      nebivolol (BYSTOLIC) 10 MG tablet Take 10 mg by mouth daily. 03/10/20   [provider]   nitroGLYCERIN (NITROSTAT) 0.4 MG SL tablet Place 0.4 mg under the tongue every 5 (five) minutes as needed for chest pain.    [provider]  topiramate (TOPAMAX) 50 MG tablet Take 50 mg by mouth daily.    [provider]  UNABLE TO FIND Allergy injections- 2 injection q weekly given by [facility]    [provider]  Vitamin D, Ergocalciferol, (DRISDOL) 50000 units CAPS capsule Take 1 capsule by mouth once a week. 05/07/17   [provider]    Physical Exam    Vital Signs:  Gabby Rackers does not have vital signs available for review today.  Given telephonic nature of communication, physical exam is limited. AAOx3. NAD. Normal affect.  Speech and respirations are unlabored.  Accessory Clinical Findings    None  Assessment & Plan    1.  Preoperative Cardiovascular Risk Assessment:     Ms. Stegman perioperative risk of a major cardiac event is 0.4% according to the Revised Cardiac Risk Index (RCRI).  Therefore, she is at low risk for perioperative complications.   Her functional capacity is excellent at 6.61 METs according to the Duke Activity Status Index (DASI). Recommendations: According to ACC/AHA guidelines, no further cardiovascular testing needed.  The patient may proceed to surgery at acceptable risk.   Antiplatelet and/or Anticoagulation Recommendations: She is not on any antiplatelet or anticoagulation medications that need to be held prior to surgery.  She does not require SBE prophylaxis prior to surgery.  The patient was advised that if she develops new symptoms prior to surgery to contact our office to arrange for a follow-up visit, and she verbalized understanding.  A copy of this note will be routed to requesting surgeon.  Time:   Today, I have spent 12 minutes with the patient with telehealth technology discussing medical history, symptoms, and management plan.     Finis Bud, NP  12/27/2021, 9:19 AM

## 2022-06-25 ENCOUNTER — Other Ambulatory Visit: Payer: Self-pay | Admitting: Family Medicine

## 2022-06-25 DIAGNOSIS — Z1231 Encounter for screening mammogram for malignant neoplasm of breast: Secondary | ICD-10-CM

## 2022-06-28 ENCOUNTER — Other Ambulatory Visit: Payer: Self-pay | Admitting: Physician Assistant

## 2022-07-03 NOTE — Progress Notes (Unsigned)
Office Visit    Patient Name: Suzanne Donaldson Date of Encounter: 07/04/2022  Primary Care Provider:  Britt Bottom, MD Primary Cardiologist:  Donato Schultz, MD Primary Electrophysiologist: None   Past Medical History    Past Medical History:  Diagnosis Date   Allergy    Bursitis    Bursitis    right wrist   Cataract    removed by surgery - bilateral   Hypercholesteremia    Hypertension    Past Surgical History:  Procedure Laterality Date   ANAL FISTULOTOMY     CARDIAC CATHETERIZATION     CATARACT EXTRACTION Bilateral    EXPLORATORY LAPAROTOMY     EYE SURGERY Left    floaters - laser   wisdom teeth ext      Allergies  Allergies  Allergen Reactions   Levofloxacin Itching   Sulfur Dioxide Hives   Aspirin Hives   Biaxin [Clarithromycin]     Interferes with Cardizem   Penicillins Hives   Ceftin [Cefuroxime Axetil] Rash   Cortisone Other (See Comments)    Pt felt "wierd"   Cortizone-10 [Hydrocortisone] Rash    Can take this medication by injections but by mouth it causes a rash   Prednisone Rash   Sulfa Antibiotics Rash     History of Present Illness    Suzanne Donaldson  is a 62 year old female with a PMH of nonobstructive CAD, mild MR, hyperlipidemia, HTN who presents today for complaint of shortness of breath.  Suzanne Donaldson was initially seen in 2019 by Dr. Anne Fu for evaluation of coronary artery disease.  She has a prior history of left heart cath in 2009 with 80% blockage and was treated medically without stenting.  She had nuclear stress test completed in 2015 that was normal.  She underwent a repeat nuclear stress test that showed normal EF and low risk study with no evidence of ischemia.  She was seen by Tereso Newcomer, PA on 03/2021 and was doing well with complaint of some lower extremity edema that resolves with elevation.  Since last being seen in the office patient reports she has been feeling episodes of fatigue and shortness of breath with  physical activity.  She notes that her shortness of breath and fatigue with activity has occurred following her hip replacement surgery.  Prior to her hip replacement she was quite active and was participating in heart pump activities at [employer].  She denies any dizziness or palpitations with her fatigue.  Her blood pressure today was well-controlled at 102/64 and heart rate was 53 bpm.  She is also noted a 8 pound weight gain since last year and patient has recently started walking 1 to 2 miles per her orthopedic surgeon.  Patient denies chest pain, palpitations, dyspnea, PND, orthopnea, nausea, vomiting, dizziness, syncope, edema, weight gain, or early satiety.  Home Medications    Current Outpatient Medications  Medication Sig Dispense Refill   atorvastatin (LIPITOR) 80 MG tablet Take 1 tablet by mouth once daily 90 tablet 1   chlorthalidone (HYGROTON) 25 MG tablet Take 12.5 mg by mouth daily.     diltiazem (CARDIZEM CD) 180 MG 24 hr capsule Take 180 mg by mouth daily.     furosemide (LASIX) 20 MG tablet Take 20 mg by mouth.     lisinopril (ZESTRIL) 20 MG tablet Take 20 mg by mouth daily.     loratadine (CLARITIN) 10 MG tablet Take 10 mg by mouth daily.     nebivolol (BYSTOLIC)  10 MG tablet Take 10 mg by mouth daily.     nitroGLYCERIN (NITROSTAT) 0.4 MG SL tablet Place 0.4 mg under the tongue every 5 (five) minutes as needed for chest pain.     potassium chloride SA (KLOR-CON M) 20 MEQ tablet Take 20 mEq by mouth daily.     topiramate (TOPAMAX) 50 MG tablet Take 50 mg by mouth daily.     UNABLE TO FIND Allergy injections- 2 injection q weekly given by [facility]     Vitamin D, Ergocalciferol, (DRISDOL) 50000 units CAPS capsule Take 1 capsule by mouth once a week.     No current facility-administered medications for this visit.     Review of Systems  Please see the history of present illness.    (+)Tiredness (+)Fatigue  All other systems reviewed and are otherwise negative except as  noted above.  Physical Exam    Wt Readings from Last 3 Encounters:  07/04/22 165 lb 9.6 oz (75.1 kg)  04/17/21 153 lb (69.4 kg)  07/20/20 160 lb (72.6 kg)   VS: Vitals:   07/04/22 1423  BP: 102/64  Pulse: (!) 53  SpO2: 95%  ,Body mass index is 28.43 kg/m.  Constitutional:      Appearance: Healthy appearance. Not in distress.  Neck:     Vascular: JVD normal.  Pulmonary:     Effort: Pulmonary effort is normal.     Breath sounds: No wheezing. No rales. Diminished in the bases Cardiovascular:     Normal rate. Regular rhythm. Normal S1. Normal S2.      Murmurs: There is no murmur.  Edema:    Peripheral edema absent.  Abdominal:     Palpations: Abdomen is soft non tender. There is no hepatomegaly.  Skin:    General: Skin is warm and dry.  Neurological:     General: No focal deficit present.     Mental Status: Alert and oriented to person, place and time.     Cranial Nerves: Cranial nerves are intact.  EKG/LABS/ Recent Cardiac Studies    ECG personally reviewed by me today -sinus bradycardia with rate of 53 bpm and no acute changes consistent with previous EKG.  Cardiac Studies & Procedures     STRESS TESTS  MYOCARDIAL PERFUSION IMAGING 06/04/2017  Narrative  Nuclear stress EF: 62%.  Blood pressure demonstrated a normal response to exercise.  There was no ST segment deviation noted during stress.  The study is normal.  This is a low risk study.  The left ventricular ejection fraction is normal (55-65%).   ECHOCARDIOGRAM  ECHOCARDIOGRAM COMPLETE 06/04/2017  Narrative *[facility]* [address] [place], [place] [address] [phone]  ------------------------------------------------------------------- Transthoracic Echocardiography  Patient:    Suzanne Donaldson MR #:       [id number] Study Date: 06/04/2017 Gender:     F Age:        29 Height:     162.6 cm Weight:     87.5 kg BSA:        2.02 m^2 Pt. Status: Room:  SONOGRAPHER  Vergia Alcon ATTENDING    Donato Schultz, M.D. ORDERING     Donato Schultz, M.D. REFERRING    Donato Schultz, M.D. PERFORMING   [facility], Outpatient  cc:  ------------------------------------------------------------------- LV EF: 60% -   65%  ------------------------------------------------------------------- Indications:      (I25.10).  ------------------------------------------------------------------- History:   PMH:  Acquired from the patient and from the patient&'s chart.  Coronary artery disease.  Risk factors:  Hypertension. Dyslipidemia.  ------------------------------------------------------------------- Study Conclusions  - Left ventricle: The cavity size was normal. Wall thickness was increased in a pattern of mild LVH. Systolic function was normal. The estimated ejection fraction was in the range of 60% to 65%. Wall motion was normal; there were no regional wall motion abnormalities. Doppler parameters are consistent with abnormal left ventricular relaxation (grade 1 diastolic dysfunction). The E/e&' ratio is between 8-15, suggesting indeterminate LV filling pressure. - Aortic valve: Transvalvular velocity was minimally increased. There was no stenosis. There was no regurgitation. - Mitral valve: Mildly thickened leaflets . There was mild regurgitation. - Left atrium: The atrium was normal in size. - Inferior vena cava: The vessel was normal in size. The respirophasic diameter changes were in the normal range (>= 50%), consistent with normal central venous pressure.  Impressions:  - LVEF 60-65%, mild LVH, normal wall motion, grade 1 DD, indeterminate LV filling pressure, minimally increased aortic valve velocity without stenosis, mild MR, normal LA size, normal IVC.  ------------------------------------------------------------------- Study data:  No prior study was available for comparison.  Study status:  Routine.  Procedure:  The patient reported no pain pre or post test.  Transthoracic echocardiography. Image quality was adequate.  Study completion:  There were no complications. Transthoracic echocardiography.  M-mode, complete 2D, spectral Doppler, and color Doppler.  Birthdate:  Patient birthdate: Jul 11, 1960.  Age:  Patient is 62 yr old.  Sex:  Gender: female. BMI: 33.1 kg/m^2.  Blood pressure:     130/82  Patient status: Outpatient.  Study date:  Study date: 06/04/2017. Study time: 08:39 AM.  Location:  [facility]  -------------------------------------------------------------------  ------------------------------------------------------------------- Left ventricle:  The cavity size was normal. Wall thickness was increased in a pattern of mild LVH. Systolic function was normal. The estimated ejection fraction was in the range of 60% to 65%. Wall motion was normal; there were no regional wall motion abnormalities. Doppler parameters are consistent with abnormal left ventricular relaxation (grade 1 diastolic dysfunction). The E/e&' ratio is between 8-15, suggesting indeterminate LV filling pressure.  ------------------------------------------------------------------- Aortic valve:   Doppler:  Transvalvular velocity was minimally increased. There was no stenosis. There was no regurgitation.  ------------------------------------------------------------------- Aorta:  Aortic root: The aortic root was normal in size. Ascending aorta: The ascending aorta was normal in size.  ------------------------------------------------------------------- Mitral valve:   Mildly thickened leaflets .  Doppler:  There was mild regurgitation.    Peak gradient (D): 3 mm Hg.  ------------------------------------------------------------------- Left atrium:  The atrium was normal in size.  ------------------------------------------------------------------- Atrial septum:  No defect or patent foramen ovale was  identified.  ------------------------------------------------------------------- Right ventricle:  The cavity size was normal. Wall thickness was normal. Systolic function was normal.  ------------------------------------------------------------------- Pulmonic valve:    The valve appears to be grossly normal. Doppler:  There was no significant regurgitation.  ------------------------------------------------------------------- Tricuspid valve:   Doppler:  There was no significant regurgitation.  ------------------------------------------------------------------- Pulmonary artery:   The main pulmonary artery was normal-sized.  ------------------------------------------------------------------- Right atrium:  The atrium was normal in size.  ------------------------------------------------------------------- Pericardium:  There was no pericardial effusion.  ------------------------------------------------------------------- Systemic veins: Inferior vena cava: The vessel was normal in size. The respirophasic diameter changes were in the normal range (>= 50%), consistent with normal central venous pressure.  ------------------------------------------------------------------- Measurements  Left ventricle                           Value        Reference  LV ID, ED, PLAX chordal                  45.3  mm     43 - 52 LV ID, ES, PLAX chordal                  25.7  mm     23 - 38 LV fx shortening, PLAX chordal           43    %      >=29 LV PW thickness, ED                      10.7  mm     --------- IVS/LV PW ratio, ED                      1.02         <=1.3 Stroke volume, 2D                        71    ml     --------- Stroke volume/bsa, 2D                    35    ml/m^2 --------- LV ejection fraction, 1-p A4C            57    %      --------- LV end-diastolic volume, 2-p             71    ml     --------- LV end-systolic volume, 2-p              29    ml     --------- LV ejection  fraction, 2-p                59    %      --------- Stroke volume, 2-p                       42    ml     --------- LV end-diastolic volume/bsa, 2-p         35    ml/m^2 --------- LV end-systolic volume/bsa, 2-p          14    ml/m^2 --------- Stroke volume/bsa, 2-p                   20.8  ml/m^2 --------- LV e&', lateral                           6.43  cm/s   --------- LV E/e&', lateral                         14.2         --------- LV e&', medial                            5.56  cm/s   --------- LV E/e&', medial                          16.42        --------- LV e&', average  6     cm/s   --------- LV E/e&', average                         15.23        ---------  Ventricular septum                       Value        Reference IVS thickness, ED                        10.9  mm     ---------  LVOT                                     Value        Reference LVOT ID, S                               18    mm     --------- LVOT area                                2.54  cm^2   --------- LVOT ID                                  18    mm     --------- LVOT peak velocity, S                    139   cm/s   --------- LVOT mean velocity, S                    84.5  cm/s   --------- LVOT VTI, S                              27.9  cm     --------- LVOT peak gradient, S                    8     mm Hg  --------- Stroke volume (SV), LVOT DP              71    ml     --------- Stroke index (SV/bsa), LVOT DP           35.1  ml/m^2 ---------  Aorta                                    Value        Reference Aortic root ID, ED                       32    mm     --------- Ascending aorta ID, A-P, S               28    mm     ---------  Left atrium  Value        Reference LA ID, A-P, ES                           38    mm     --------- LA ID/bsa, A-P                           1.88  cm/m^2 <=2.2 LA volume, S                             50    ml      --------- LA volume/bsa, S                         24.7  ml/m^2 --------- LA volume, ES, 1-p A4C                   41    ml     --------- LA volume/bsa, ES, 1-p A4C               20.3  ml/m^2 --------- LA volume, ES, 1-p A2C                   54    ml     --------- LA volume/bsa, ES, 1-p A2C               26.7  ml/m^2 ---------  Mitral valve                             Value        Reference Mitral E-wave peak velocity              91.3  cm/s   --------- Mitral A-wave peak velocity              71.1  cm/s   --------- Mitral deceleration time                 180   ms     150 - 230 Mitral peak gradient, D                  3     mm Hg  --------- Mitral E/A ratio, peak                   1.3          ---------  Systemic veins                           Value        Reference Estimated CVP                            3     mm Hg  ---------  Right ventricle                          Value        Reference RV s&', lateral, S                        13.5  cm/s   ---------  Legend: (L)  and  (H)  mark values outside specified reference range.  ------------------------------------------------------------------- Prepared and Electronically Authenticated by  Zoila Shutter MD 2019-04-10T12:22:40             Risk Assessment/Calculations:     No results found for: "WBC", "HGB", "HCT", "MCV", "PLT" No results found for: "CREATININE", "BUN", "NA", "K", "CL", "CO2" Lab Results  Component Value Date   ALT 20 07/30/2017   Lab Results  Component Value Date   CHOL 133 07/30/2017   HDL 55 07/30/2017   LDLCALC 61 07/30/2017   TRIG 85 07/30/2017   CHOLHDL 2.4 07/30/2017    No results found for: "HGBA1C"   Assessment & Plan    1.  Shortness of breath: -Patient reports shortness of breath and fatigue with activity that has occurred over the past 2 to 3 months. -She had hip replacement surgery and has been an active since September. -We will have patient complete a 2D echo to rule out possible  structural and valvular heart changes associated with shortness of breath.  2.  Nonobstructive coronary artery disease: -s/p LHC 2009 with no PCI and medically treated obstruction. -Patient had normal Myoview and 2019 that showed no evidence of ischemia. -Today patient reports shortness of breath but no chest pain or tightness with activity.  3.  Essential hypertension: -Patient's blood pressure today was 102/64 -Continue Cardizem 180 mg daily, lisinopril 20 mg, nebivolol 10 mg  4.  Hypercholesteremia: -Patient's last LDL cholesterol was 61 -Continue atorvastatin 80 mg daily  Disposition: Follow-up with Donato Schultz, MD or APP in 12 months   Medication Adjustments/Labs and Tests Ordered: Current medicines are reviewed at length with the patient today.  Concerns regarding medicines are outlined above.   Signed, Napoleon Form, Leodis Rains, NP 07/04/2022, 2:39 PM [facility]

## 2022-07-04 ENCOUNTER — Ambulatory Visit
Admission: RE | Admit: 2022-07-04 | Discharge: 2022-07-04 | Disposition: A | Discharge status: Home / Self Care | Payer: BC Managed Care – PPO | Source: Ambulatory Visit | Attending: Family Medicine | Admitting: Family Medicine

## 2022-07-04 ENCOUNTER — Ambulatory Visit: Payer: BC Managed Care – PPO | Attending: Nurse Practitioner | Admitting: Nurse Practitioner

## 2022-07-04 ENCOUNTER — Encounter: Payer: Self-pay | Admitting: Nurse Practitioner

## 2022-07-04 VITALS — BP 102/64 | HR 53 | Ht 64.0 in | Wt 165.6 lb

## 2022-07-04 DIAGNOSIS — Z1231 Encounter for screening mammogram for malignant neoplasm of breast: Secondary | ICD-10-CM | POA: Insufficient documentation

## 2022-07-04 DIAGNOSIS — I10 Essential (primary) hypertension: Secondary | ICD-10-CM

## 2022-07-04 DIAGNOSIS — E7800 Pure hypercholesterolemia, unspecified: Secondary | ICD-10-CM | POA: Diagnosis not present

## 2022-07-04 DIAGNOSIS — I2510 Atherosclerotic heart disease of native coronary artery without angina pectoris: Secondary | ICD-10-CM | POA: Diagnosis not present

## 2022-07-04 DIAGNOSIS — R0602 Shortness of breath: Secondary | ICD-10-CM | POA: Diagnosis not present

## 2022-07-04 NOTE — Patient Instructions (Addendum)
Medication Instructions:  Your physician recommends that you continue on your current medications as directed. Please refer to the Current Medication list given to you today. *If you need a refill on your cardiac medications before your next appointment, please call your pharmacy*   Lab Work: None ordered   Testing/Procedures: Your physician has requested that you have an echocardiogram. Echocardiography is a painless test that uses sound waves to create images of your heart. It provides your doctor with information about the size and shape of your heart and how well your heart's chambers and valves are working. This procedure takes approximately one hour. There are no restrictions for this procedure. Please do NOT wear cologne, perfume, aftershave, or lotions (deodorant is allowed). Please arrive 15 minutes prior to your appointment time.   Follow-Up: At [facility], you and your health needs are our priority.  As part of our continuing mission to provide you with exceptional heart care, we have created designated Provider Care Teams.  These Care Teams include your primary Cardiologist (physician) and Advanced Practice Providers (APPs -  Physician Assistants and Nurse Practitioners) who all work together to provide you with the care you need, when you need it.  We recommend signing up for the patient portal called "MyChart".  Sign up information is provided on this After Visit Summary.  MyChart is used to connect with patients for Virtual Visits (Telemedicine).  Patients are able to view lab/test results, encounter notes, upcoming appointments, etc.  Non-urgent messages can be sent to your provider as well.   To learn more about what you can do with MyChart, go to [email or web address].    Your next appointment:   12 month(s)  Provider:   Donato Schultz, MD     Other Instructions

## 2022-07-18 ENCOUNTER — Ambulatory Visit (HOSPITAL_COMMUNITY): Payer: BC Managed Care – PPO | Attending: Nurse Practitioner

## 2022-07-18 DIAGNOSIS — R0602 Shortness of breath: Secondary | ICD-10-CM

## 2022-07-18 DIAGNOSIS — I10 Essential (primary) hypertension: Secondary | ICD-10-CM

## 2022-07-18 DIAGNOSIS — E7800 Pure hypercholesterolemia, unspecified: Secondary | ICD-10-CM | POA: Diagnosis present

## 2022-07-18 DIAGNOSIS — I2510 Atherosclerotic heart disease of native coronary artery without angina pectoris: Secondary | ICD-10-CM

## 2022-07-18 LAB — ECHOCARDIOGRAM COMPLETE
Area-P 1/2: 3.99 cm2
S' Lateral: 2.2 cm

## 2023-01-09 ENCOUNTER — Ambulatory Visit
Admission: EM | Admit: 2023-01-09 | Discharge: 2023-01-09 | Disposition: A | Discharge status: Home / Self Care | Payer: BC Managed Care – PPO | Attending: Emergency Medicine | Admitting: Emergency Medicine

## 2023-01-09 DIAGNOSIS — H1131 Conjunctival hemorrhage, right eye: Secondary | ICD-10-CM

## 2023-01-09 MED ORDER — OLOPATADINE HCL 0.2 % OP SOLN: 1.0000 [drp] | Freq: Every day | OPHTHALMIC | 0 refills | Status: AC

## 2023-01-09 NOTE — Discharge Instructions (Signed)
Subconjunctival hemorrhage is bleeding that happens between the [place] part of your eye (sclera) and the clear membrane that covers the outside of your eye (conjunctiva). There are many tiny blood vessels near the surface of your eye. A subconjunctival hemorrhage happens when one or more of these vessels breaks and bleeds, causing a red patch to appear on your eye. This is similar to a bruise. Depending on the amount of bleeding, the red patch may only cover a small area of your eye or it may cover the entire visible part of the sclera. If a lot of blood collects under the conjunctiva, there may also be swelling. Subconjunctival hemorrhages do not affect your vision or cause pain, but your eye may feel irritated if there is swelling. Subconjunctival hemorrhages usually do not require treatment, and they usually disappear on their own within two to four weeks.   You may use eyedrops once daily to help manage itching, may also take a daily allergy medicine such as Claritin, Zyrtec etc.  Avoid eye touching and rubbing as this can contaminate the eye with germs  You may continue use of your contacts  For any new symptoms you may follow-up for reevaluation

## 2023-01-09 NOTE — ED Triage Notes (Signed)
Patient presents to UC for right eye redness since yesterday morning. States she woke up with redness, some improvement with saline drops and dry eye. States she just finished a round of antibiotics for sinus infection.

## 2023-01-09 NOTE — ED Provider Notes (Signed)
Suzanne Donaldson    CSN: [id number] Arrival date & time: 01/09/23  4098      History   Chief Complaint Chief Complaint  Patient presents with   Eye Problem    HPI Suzanne Donaldson is a 62 y.o. female.    Patient presents for evaluation of erythema to the corner of the right eye beginning 1 day ago.  Experiencing mild pruritus.  Symptoms were present upon awakening.  Has attempted use of saline drops which did show some improvement.  Recently was treated for sinusitis approximately 1 week ago, has mild lingering cough, much improved.  Denies eye drainage, pain, visual changes, light sensitivity, injury or trauma.   Past Medical History:  Diagnosis Date   Allergy    Bursitis    Bursitis    right wrist   Cataract    removed by surgery - bilateral   Hypercholesteremia    Hypertension     Patient Active Problem List   Diagnosis Date Noted   Coronary artery disease involving native coronary artery of native heart without angina pectoris 07/08/2018   Essential hypertension 07/08/2018   Pure hypercholesterolemia 07/08/2018    Past Surgical History:  Procedure Laterality Date   ANAL FISTULOTOMY     CARDIAC CATHETERIZATION     CATARACT EXTRACTION Bilateral    EXPLORATORY LAPAROTOMY     EYE SURGERY Left    floaters - laser   wisdom teeth ext      OB History   No obstetric history on file.      Home Medications    Prior to Admission medications   Medication Sig Start Date End Date Taking? Authorizing Provider  Olopatadine HCl 0.2 % SOLN Apply 1 drop to eye daily. 01/09/23  Yes Valinda Hoar, NP  atorvastatin (LIPITOR) 80 MG tablet Take 1 tablet by mouth once daily 06/28/22   Sharlene Dory, NP  chlorthalidone (HYGROTON) 25 MG tablet Take 12.5 mg by mouth daily.    [provider]  diltiazem (CARDIZEM CD) 180 MG 24 hr capsule Take 180 mg by mouth daily. 09/27/19   [provider]  furosemide (LASIX) 20 MG tablet Take 20 mg by mouth.     [provider]  lisinopril (ZESTRIL) 20 MG tablet Take 20 mg by mouth daily. 08/20/19   [provider]  loratadine (CLARITIN) 10 MG tablet Take 10 mg by mouth daily.    [provider]  nebivolol (BYSTOLIC) 10 MG tablet Take 10 mg by mouth daily. 03/10/20   [provider]  nitroGLYCERIN (NITROSTAT) 0.4 MG SL tablet Place 0.4 mg under the tongue every 5 (five) minutes as needed for chest pain.    [provider]  potassium chloride SA (KLOR-CON M) 20 MEQ tablet Take 20 mEq by mouth daily. 06/18/22   [provider]  topiramate (TOPAMAX) 50 MG tablet Take 50 mg by mouth daily.    [provider]  UNABLE TO FIND Allergy injections- 2 injection q weekly given by [facility]    [provider]  Vitamin D, Ergocalciferol, (DRISDOL) 50000 units CAPS capsule Take 1 capsule by mouth once a week. 05/07/17   [provider]    Family History Family History  Problem Relation Age of Onset   Heart disease Mother    Hypertension Mother    Heart disease Father    Hypertension Father    Diabetes Father    Heart disease Sister    Hypertension Sister    Diabetes Sister  Heart attack Maternal Grandfather    Colon cancer Neg Hx    Breast cancer Neg Hx    Mental illness Neg Hx    Rectal cancer Neg Hx    Stomach cancer Neg Hx     Social History Social History   Tobacco Use   Smoking status: Never   Smokeless tobacco: Never  Vaping Use   Vaping status: Never Used  Substance Use Topics   Alcohol use: No   Drug use: No     Allergies   Levofloxacin, Sulfur dioxide, Aspirin, Biaxin [clarithromycin], Penicillins, Ceftin [cefuroxime axetil], Cortisone, Cortizone-10 [hydrocortisone], Prednisone, and Sulfa antibiotics   Review of Systems Review of Systems   Physical Exam Triage Vital Signs ED Triage Vitals  Encounter Vitals Group     BP 01/09/23 0829 107/72     Systolic BP Percentile --      Diastolic BP  Percentile --      Pulse Rate 01/09/23 0829 (!) 56     Resp 01/09/23 0829 16     Temp 01/09/23 0829 (!) 97.2 F (36.2 C)     Temp Source 01/09/23 0829 Temporal     SpO2 01/09/23 0829 97 %     Weight --      Height --      Head Circumference --      Peak Flow --      Pain Score 01/09/23 0828 0     Pain Loc --      Pain Education --      Exclude from Growth Chart --    No data found.  Updated Vital Signs BP 107/72 (BP Location: Left Arm)   Pulse (!) 56   Temp (!) 97.2 F (36.2 C) (Temporal)   Resp 16   LMP  (LMP Unknown)   SpO2 97%   Visual Acuity Right Eye Distance:   Left Eye Distance:   Bilateral Distance:    Right Eye Near:   Left Eye Near:    Bilateral Near:     Physical Exam Constitutional:      Appearance: Normal appearance.  Eyes:     Extraocular Movements: Extraocular movements intact.      Comments: Hemorrhage present to the medial aspect of the sclera, vision grossly intact, extraocular movements intact, no drainage noted on exam  Pulmonary:     Effort: Pulmonary effort is normal.  Neurological:     Mental Status: She is alert and oriented to person, place, and time. Mental status is at baseline.      UC Treatments / Results  Labs (all labs ordered are listed, but only abnormal results are displayed) Labs Reviewed - No data to display  EKG   Radiology No results found.  Procedures Procedures (including critical care time)  Medications Ordered in UC Medications - No data to display  Initial Impression / Assessment and Plan / UC Course  I have reviewed the triage vital signs and the nursing notes.  Pertinent labs & imaging results that were available during my care of the patient were reviewed by me and considered in my medical decision making (see chart for details).  Conjunctival hemorrhage of right eye  Presentation and symptomology is consistent with above diagnosis, discussed with patient most likely related to persistent  coughing, discussed timeline for possible resolution, prescribed Pataday eyedrops for management of pruritus, may also use oral antihistamine, may follow-up for reevaluation for any new symptoms Final Clinical Impressions(s) / UC Diagnoses   Final diagnoses:  Conjunctival  hemorrhage of right eye     Discharge Instructions      Subconjunctival hemorrhage is bleeding that happens between the [place] part of your eye (sclera) and the clear membrane that covers the outside of your eye (conjunctiva). There are many tiny blood vessels near the surface of your eye. A subconjunctival hemorrhage happens when one or more of these vessels breaks and bleeds, causing a red patch to appear on your eye. This is similar to a bruise. Depending on the amount of bleeding, the red patch may only cover a small area of your eye or it may cover the entire visible part of the sclera. If a lot of blood collects under the conjunctiva, there may also be swelling. Subconjunctival hemorrhages do not affect your vision or cause pain, but your eye may feel irritated if there is swelling. Subconjunctival hemorrhages usually do not require treatment, and they usually disappear on their own within two to four weeks.   You may use eyedrops once daily to help manage itching, may also take a daily allergy medicine such as Claritin, Zyrtec etc.  Avoid eye touching and rubbing as this can contaminate the eye with germs  You may continue use of your contacts  For any new symptoms you may follow-up for reevaluation   ED Prescriptions     Medication Sig Dispense Auth. Provider   Olopatadine HCl 0.2 % SOLN Apply 1 drop to eye daily. 2.5 mL Valinda Hoar, NP      PDMP not reviewed this encounter.   Valinda Hoar, [place] 01/09/23 [phone]

## 2023-06-27 ENCOUNTER — Ambulatory Visit
Admission: EM | Admit: 2023-06-27 | Discharge: 2023-06-27 | Disposition: A | Discharge status: Home / Self Care | Attending: Physician Assistant | Admitting: Physician Assistant

## 2023-06-27 DIAGNOSIS — J0120 Acute ethmoidal sinusitis, unspecified: Secondary | ICD-10-CM | POA: Diagnosis not present

## 2023-06-27 MED ORDER — DOXYCYCLINE HYCLATE 100 MG PO CAPS: 100.0000 mg | ORAL_CAPSULE | Freq: Two times a day (BID) | ORAL | 0 refills | Status: DC

## 2023-06-27 NOTE — Discharge Instructions (Signed)
Continue allergy medications

## 2023-06-27 NOTE — ED Triage Notes (Addendum)
 Patient to Urgent Care with complaints of watery eyes/ sneezing/ cough/ headaches/ chills. Unsure of any fevers.   Reports symptoms started on Tuesday. Reports her eyes started watering yesterday. Reports her grandchildren have been sick.  Meds: Claritin daily/ tylenol .

## 2023-06-27 NOTE — ED Provider Notes (Signed)
 Suzanne Donaldson    CSN: [id number] Arrival date & time: 06/27/23  0831      History   Chief Complaint Chief Complaint  Patient presents with   Cough   Headache    HPI Suzanne Donaldson is a 63 y.o. female.   Patient complains of cough and congestion.  Patient reports that she has pain in her frontal sinus area.  Patient reports her eyes have been tearing.  Patient states she felt like she had allergies but now feels like she is getting a sinus infection.  Patient is allergic to multiple antibiotics she states her ENT normally treats her with doxycycline  which usually works.  Patient has been taking allergy medication and she has Patanol drops at home  The history is provided by the patient. No language interpreter was used.  Cough Cough characteristics:  Non-productive Sputum characteristics:  Nondescript Severity:  Moderate Timing:  Constant Progression:  Worsening Chronicity:  New Smoker: no   Relieved by:  Nothing Worsened by:  Nothing Associated symptoms: headaches   Headache Associated symptoms: cough     Past Medical History:  Diagnosis Date   Allergy    Bursitis    Bursitis    right wrist   Cataract    removed by surgery - bilateral   Hypercholesteremia    Hypertension     Patient Active Problem List   Diagnosis Date Noted   Coronary artery disease involving native coronary artery of native heart without angina pectoris 07/08/2018   Essential hypertension 07/08/2018   Pure hypercholesterolemia 07/08/2018    Past Surgical History:  Procedure Laterality Date   ANAL FISTULOTOMY     CARDIAC CATHETERIZATION     CATARACT EXTRACTION Bilateral    EXPLORATORY LAPAROTOMY     EYE SURGERY Left    floaters - laser   wisdom teeth ext      OB History   No obstetric history on file.      Home Medications    Prior to Admission medications   Medication Sig Start Date End Date Taking? Authorizing Provider  doxycycline  (VIBRAMYCIN ) 100 MG capsule  Take 1 capsule (100 mg total) by mouth 2 (two) times daily. 06/27/23  Yes Keiandra Sullenger K, PA-C  atorvastatin  (LIPITOR) 80 MG tablet Take 1 tablet by mouth once daily 06/28/22   Lasalle Pointer, NP  chlorthalidone (HYGROTON) 25 MG tablet Take 12.5 mg by mouth daily.    [provider]  diltiazem (CARDIZEM CD) 180 MG 24 hr capsule Take 180 mg by mouth daily. 09/27/19   [provider]  furosemide (LASIX) 20 MG tablet Take 20 mg by mouth.    [provider]  lisinopril (ZESTRIL) 20 MG tablet Take 20 mg by mouth daily. 08/20/19   [provider]  loratadine (CLARITIN) 10 MG tablet Take 10 mg by mouth daily.    [provider]  nebivolol (BYSTOLIC) 10 MG tablet Take 10 mg by mouth daily. 03/10/20   [provider]  nitroGLYCERIN (NITROSTAT) 0.4 MG SL tablet Place 0.4 mg under the tongue every 5 (five) minutes as needed for chest pain.    [provider]  Olopatadine  HCl 0.2 % SOLN Apply 1 drop to eye daily. 01/09/23   White, Maybelle Spatz, NP  potassium chloride SA (KLOR-CON M) 20 MEQ tablet Take 20 mEq by mouth daily. 06/18/22   [provider]  topiramate (TOPAMAX) 50 MG tablet Take 50 mg by mouth daily.    [provider]  UNABLE TO  FIND Allergy injections- 2 injection q weekly given by [facility]    [provider]  Vitamin D, Ergocalciferol, (DRISDOL) 50000 units CAPS capsule Take 1 capsule by mouth once a week. 05/07/17   [provider]    Family History Family History  Problem Relation Age of Onset   Heart disease Mother    Hypertension Mother    Heart disease Father    Hypertension Father    Diabetes Father    Heart disease Sister    Hypertension Sister    Diabetes Sister    Heart attack Maternal Grandfather    Colon cancer Neg Hx    Breast cancer Neg Hx    Mental illness Neg Hx    Rectal cancer Neg Hx    Stomach cancer Neg Hx     Social History Social History   Tobacco Use   Smoking  status: Never   Smokeless tobacco: Never  Vaping Use   Vaping status: Never Used  Substance Use Topics   Alcohol use: No   Drug use: No     Allergies   Levofloxacin, Sulfur dioxide, Aspirin, Biaxin [clarithromycin], Penicillins, Ceftin [cefuroxime axetil], Cortisone, Cortizone-10 [hydrocortisone], Prednisone, and Sulfa antibiotics   Review of Systems Review of Systems  Respiratory:  Positive for cough.   Neurological:  Positive for headaches.  All other systems reviewed and are negative.    Physical Exam Triage Vital Signs ED Triage Vitals  Encounter Vitals Group     BP 06/27/23 0847 (!) 141/70     Systolic BP Percentile --      Diastolic BP Percentile --      Pulse Rate 06/27/23 0847 60     Resp 06/27/23 0847 18     Temp 06/27/23 0847 98.2 F (36.8 C)     Temp src --      SpO2 06/27/23 0847 96 %     Weight --      Height --      Head Circumference --      Peak Flow --      Pain Score 06/27/23 0846 2     Pain Loc --      Pain Education --      Exclude from Growth Chart --    No data found.  Updated Vital Signs BP (!) 141/70   Pulse 60   Temp 98.2 F (36.8 C)   Resp 18   LMP  (LMP Unknown)   SpO2 96%   Visual Acuity Right Eye Distance:   Left Eye Distance:   Bilateral Distance:    Right Eye Near:   Left Eye Near:    Bilateral Near:     Physical Exam Vitals and nursing note reviewed.  Constitutional:      Appearance: She is well-developed.  HENT:     Head: Normocephalic.  Eyes:     Extraocular Movements: Extraocular movements intact.  Cardiovascular:     Rate and Rhythm: Normal rate.  Pulmonary:     Effort: Pulmonary effort is normal.  Abdominal:     General: There is no distension.  Musculoskeletal:        General: Normal range of motion.     Cervical back: Normal range of motion.  Skin:    General: Skin is warm.  Neurological:     General: No focal deficit present.     Mental Status: She is alert and oriented to person, place, and  time.    Tender frontal sinuses to palpation  UC Treatments / Results  Labs (all labs ordered are listed, but only abnormal results are displayed) Labs Reviewed - No data to display  EKG   Radiology No results found.  Procedures Procedures (including critical care time)  Medications Ordered in UC Medications - No data to display  Initial Impression / Assessment and Plan / UC Course  I have reviewed the triage vital signs and the nursing notes.  Pertinent labs & imaging results that were available during my care of the patient were reviewed by me and considered in my medical decision making (see chart for details).      Final Clinical Impressions(s) / UC Diagnoses   Final diagnoses:  Acute non-recurrent ethmoidal sinusitis     Discharge Instructions      Continue allergy medications    ED Prescriptions     Medication Sig Dispense Auth. Provider   doxycycline  (VIBRAMYCIN ) 100 MG capsule Take 1 capsule (100 mg total) by mouth 2 (two) times daily. 20 capsule Alfa Leibensperger K, PA-C      PDMP not reviewed this encounter. An After Visit Summary was printed and given to the patient.       Sandi Crosby, PA-C 06/27/23 8119

## 2023-07-10 ENCOUNTER — Ambulatory Visit
Admission: EM | Admit: 2023-07-10 | Discharge: 2023-07-10 | Disposition: A | Discharge status: Home / Self Care | Attending: Emergency Medicine | Admitting: Emergency Medicine

## 2023-07-10 ENCOUNTER — Ambulatory Visit (INDEPENDENT_AMBULATORY_CARE_PROVIDER_SITE_OTHER)

## 2023-07-10 ENCOUNTER — Encounter: Payer: Self-pay | Admitting: *Deleted

## 2023-07-10 ENCOUNTER — Other Ambulatory Visit

## 2023-07-10 DIAGNOSIS — R0781 Pleurodynia: Secondary | ICD-10-CM

## 2023-07-10 DIAGNOSIS — S2242XA Multiple fractures of ribs, left side, initial encounter for closed fracture: Secondary | ICD-10-CM | POA: Diagnosis not present

## 2023-07-10 MED ORDER — METAXALONE 800 MG PO TABS: 800.0000 mg | ORAL_TABLET | Freq: Three times a day (TID) | ORAL | 0 refills | Status: DC

## 2023-07-10 MED ORDER — DICLOFENAC SODIUM 1 % EX GEL: 2.0000 g | Freq: Four times a day (QID) | CUTANEOUS | 1 refills | Status: AC

## 2023-07-10 NOTE — ED Provider Notes (Signed)
 Suzanne Donaldson    CSN: [id number] Arrival date & time: 07/10/23  1742      History   Chief Complaint Chief Complaint  Patient presents with   Fall    HPI Suzanne Donaldson is a 63 y.o. female.   Patient presents for evaluation of left-sided rib pain beginning 2 days ago after fall.  Was walking along a sidewalk when she tripped and fell catching self on the left side.  Now having pain underneath the left breast wrapping into the flank.  Has an abrasion to the left elbow.   Past Medical History:  Diagnosis Date   Allergy    Bursitis    Bursitis    right wrist   Cataract    removed by surgery - bilateral   Hypercholesteremia    Hypertension     Patient Active Problem List   Diagnosis Date Noted   Coronary artery disease involving native coronary artery of native heart without angina pectoris 07/08/2018   Essential hypertension 07/08/2018   Pure hypercholesterolemia 07/08/2018    Past Surgical History:  Procedure Laterality Date   ANAL FISTULOTOMY     CARDIAC CATHETERIZATION     CATARACT EXTRACTION Bilateral    EXPLORATORY LAPAROTOMY     EYE SURGERY Left    floaters - laser   wisdom teeth ext      OB History   No obstetric history on file.      Home Medications    Prior to Admission medications   Medication Sig Start Date End Date Taking? Authorizing Provider  diclofenac Sodium (VOLTAREN) 1 % GEL Apply 2 g topically 4 (four) times daily. 07/10/23  Yes Sabre Romberger R, NP  metaxalone (SKELAXIN) 800 MG tablet Take 1 tablet (800 mg total) by mouth 3 (three) times daily. 07/10/23  Yes Reena Canning, NP  atorvastatin  (LIPITOR) 80 MG tablet Take 1 tablet by mouth once daily 06/28/22   Lasalle Pointer, NP  chlorthalidone (HYGROTON) 25 MG tablet Take 12.5 mg by mouth daily.    [provider]  diltiazem (CARDIZEM CD) 180 MG 24 hr capsule Take 180 mg by mouth daily. 09/27/19   [provider]  doxycycline  (VIBRAMYCIN ) 100 MG capsule Take 1  capsule (100 mg total) by mouth 2 (two) times daily. 06/27/23   Sofia, Leslie K, PA-C  furosemide (LASIX) 20 MG tablet Take 20 mg by mouth.    [provider]  lisinopril (ZESTRIL) 20 MG tablet Take 20 mg by mouth daily. 08/20/19   [provider]  loratadine (CLARITIN) 10 MG tablet Take 10 mg by mouth daily.    [provider]  nebivolol (BYSTOLIC) 10 MG tablet Take 10 mg by mouth daily. 03/10/20   [provider]  nitroGLYCERIN (NITROSTAT) 0.4 MG SL tablet Place 0.4 mg under the tongue every 5 (five) minutes as needed for chest pain.    [provider]  Olopatadine  HCl 0.2 % SOLN Apply 1 drop to eye daily. 01/09/23   Diontre Harps, Maybelle Spatz, NP  potassium chloride SA (KLOR-CON M) 20 MEQ tablet Take 20 mEq by mouth daily. 06/18/22   [provider]  topiramate (TOPAMAX) 50 MG tablet Take 50 mg by mouth daily.    [provider]  UNABLE TO FIND Allergy injections- 2 injection q weekly given by [facility]    [provider]  Vitamin D, Ergocalciferol, (DRISDOL) 50000 units CAPS capsule Take 1 capsule by mouth once a week. 05/07/17   [provider]  Family History Family History  Problem Relation Age of Onset   Heart disease Mother    Hypertension Mother    Heart disease Father    Hypertension Father    Diabetes Father    Heart disease Sister    Hypertension Sister    Diabetes Sister    Heart attack Maternal Grandfather    Colon cancer Neg Hx    Breast cancer Neg Hx    Mental illness Neg Hx    Rectal cancer Neg Hx    Stomach cancer Neg Hx     Social History Social History   Tobacco Use   Smoking status: Never   Smokeless tobacco: Never  Vaping Use   Vaping status: Never Used  Substance Use Topics   Alcohol use: No   Drug use: No     Allergies   Levofloxacin, Sulfur dioxide, Aspirin, Biaxin [clarithromycin], Penicillins, Ceftin [cefuroxime axetil], Cortisone, Cortizone-10 [hydrocortisone],  Prednisone, and Sulfa antibiotics   Review of Systems Review of Systems   Physical Exam Triage Vital Signs ED Triage Vitals  Encounter Vitals Group     BP 07/10/23 1846 102/66     Systolic BP Percentile --      Diastolic BP Percentile --      Pulse Rate 07/10/23 1846 60     Resp 07/10/23 1846 20     Temp 07/10/23 1846 98 F (36.7 C)     Temp Source 07/10/23 1846 Oral     SpO2 07/10/23 1846 97 %     Weight 07/10/23 1844 162 lb (73.5 kg)     Height 07/10/23 1844 5\' 4"  (1.626 m)     Head Circumference --      Peak Flow --      Pain Score 07/10/23 1844 6     Pain Loc --      Pain Education --      Exclude from Growth Chart --    No data found.  Updated Vital Signs BP 102/66 (BP Location: Right Arm)   Pulse 60   Temp 98 F (36.7 C) (Oral)   Resp 20   Ht 5\' 4"  (1.626 m)   Wt 162 lb (73.5 kg)   LMP  (LMP Unknown)   SpO2 97%   BMI 27.81 kg/m   Visual Acuity Right Eye Distance:   Left Eye Distance:   Bilateral Distance:    Right Eye Near:   Left Eye Near:    Bilateral Near:     Physical Exam Constitutional:      Appearance: Normal appearance.  HENT:     Head: Normocephalic.  Eyes:     Extraocular Movements: Extraocular movements intact.  Cardiovascular:     Rate and Rhythm: Normal rate and regular rhythm.     Pulses: Normal pulses.     Heart sounds: Normal heart sounds.  Pulmonary:     Effort: Pulmonary effort is normal.     Breath sounds: Normal breath sounds.  Chest:     Comments: Tenderness present to ribs 3 through 6 over the left breast into the left flank, no ecchymosis swelling or deformity, able to complete range of motion but pain is elicited with all movement Neurological:     Mental Status: She is alert and oriented to person, place, and time. Mental status is at baseline.      UC Treatments / Results  Labs (all labs ordered are listed, but only abnormal results are displayed) Labs Reviewed - No data to display  EKG  Radiology DG  Ribs Unilateral W/Chest Left Result Date: 07/10/2023 CLINICAL DATA:  Fall left-sided rib pain EXAM: LEFT RIBS AND CHEST - 3+ VIEW COMPARISON:  04/28/2016 FINDINGS: Single view chest demonstrates atelectasis or scar in the left lower lung. Normal cardiac size. No pleural effusion. No convincing pneumothorax. Left rib series demonstrates acute mildly displaced left eighth and ninth anterolateral rib fractures. IMPRESSION: Acute mildly displaced left eighth and ninth anterolateral rib fractures. No pneumothorax. Electronically Signed   By: Esmeralda Hedge M.D.   On: 07/10/2023 18:53    Procedures Procedures (including critical care time)  Medications Ordered in UC Medications - No data to display  Initial Impression / Assessment and Plan / UC Course  I have reviewed the triage vital signs and the nursing notes.  Pertinent labs & imaging results that were available during my care of the patient were reviewed by me and considered in my medical decision making (see chart for details).  Fracture of multiple ribs on left side, rib pain on left side  X-ray shows fracture to ribs 8 and 9, discussed with patient prior to discharge, discussed possible timeline of healing, declined compression, allergy to prednisone and NSAIDs recommended continued use of Tylenol , prescribed laxation, per patient has had success within the past, additionally prescribed diclofenac gel, recommended supportive measures and advised for persisting pain to follow-up with PCP Final Clinical Impressions(s) / UC Diagnoses   Final diagnoses:  Rib pain on left side  Closed fracture of multiple ribs of left side, initial encounter   Discharge Instructions      Evaluated for rib pain after fall  X-ray shows a break in the bone in ribs 8 and 9  Ribs heal with time there is no surgical intervention but this may take up to 3 months however pain should steadily improve  You may continue use of Tylenol   May use Skelaxin every 8  hours as needed for additional comfort  You may use diclofenac gel every 6 hours for additional comfort  You have declined at this time but would recommend compression in the form of a rib belt or Ace bandage which at stability and support and helps to minimize pain with movement  You may use heat over the affected area 10 to 15-minute intervals  You may cushion the body with pillows whenever standing and lying  If symptoms continue to persist and pain does not improve please follow-up with your primary doctor    ED Prescriptions     Medication Sig Dispense Auth. Provider   metaxalone (SKELAXIN) 800 MG tablet Take 1 tablet (800 mg total) by mouth 3 (three) times daily. 21 tablet Stanislaw Acton R, NP   diclofenac Sodium (VOLTAREN) 1 % GEL Apply 2 g topically 4 (four) times daily. 50 g Reena Canning, NP      PDMP not reviewed this encounter.   Reena Canning, NP 07/10/23 1918

## 2023-07-10 NOTE — ED Triage Notes (Signed)
 Patient states she fell on Tuesday on cement, pain on  left side of rib cage/abdomen

## 2023-07-10 NOTE — Discharge Instructions (Addendum)
 Evaluated for rib pain after fall  X-ray shows a break in the bone in ribs 8 and 9  Ribs heal with time there is no surgical intervention but this may take up to 3 months however pain should steadily improve  You may continue use of Tylenol   May use Skelaxin every 8 hours as needed for additional comfort  You may use diclofenac gel every 6 hours for additional comfort  You have declined at this time but would recommend compression in the form of a rib belt or Ace bandage which at stability and support and helps to minimize pain with movement  You may use heat over the affected area 10 to 15-minute intervals  You may cushion the body with pillows whenever standing and lying  If symptoms continue to persist and pain does not improve please follow-up with your primary doctor

## 2023-08-05 ENCOUNTER — Ambulatory Visit
Admission: EM | Admit: 2023-08-05 | Discharge: 2023-08-05 | Disposition: A | Discharge status: Home / Self Care | Attending: Emergency Medicine | Admitting: Emergency Medicine

## 2023-08-05 ENCOUNTER — Encounter: Payer: Self-pay | Admitting: Emergency Medicine

## 2023-08-05 DIAGNOSIS — J029 Acute pharyngitis, unspecified: Secondary | ICD-10-CM | POA: Diagnosis not present

## 2023-08-05 DIAGNOSIS — H109 Unspecified conjunctivitis: Secondary | ICD-10-CM | POA: Diagnosis not present

## 2023-08-05 DIAGNOSIS — J069 Acute upper respiratory infection, unspecified: Secondary | ICD-10-CM

## 2023-08-05 DIAGNOSIS — Z760 Encounter for issue of repeat prescription: Secondary | ICD-10-CM

## 2023-08-05 DIAGNOSIS — B9689 Other specified bacterial agents as the cause of diseases classified elsewhere: Secondary | ICD-10-CM

## 2023-08-05 LAB — POCT RAPID STREP A (OFFICE): Rapid Strep A Screen: NEGATIVE

## 2023-08-05 MED ORDER — METAXALONE 800 MG PO TABS: 800.0000 mg | ORAL_TABLET | Freq: Three times a day (TID) | ORAL | 0 refills | Status: AC

## 2023-08-05 MED ORDER — POLYMYXIN B-TRIMETHOPRIM 10000-0.1 UNIT/ML-% OP SOLN: 1.0000 [drp] | Freq: Four times a day (QID) | OPHTHALMIC | 0 refills | Status: AC

## 2023-08-05 NOTE — Discharge Instructions (Addendum)
 Use the eyedrops as directed.  See the attached handout on bacterial conjunctivitis.    A small refill of the muscle relaxer was sent to your pharmacy.  Please see your primary care provider for additional refills if needed.  Do not drive, operate machinery, drink alcohol, or perform dangerous activities while taking this medication as it may cause drowsiness.  Your strep test is negative.  See the attached information on pharyngitis and viral respiratory infection.    Follow-up with your primary care provider.  Go to the emergency department if you have worsening symptoms.

## 2023-08-05 NOTE — ED Provider Notes (Signed)
 Suzanne Donaldson    CSN: [id number] Arrival date & time: 08/05/23  0801      History   Chief Complaint Chief Complaint  Patient presents with   Cough   Otalgia    HPI Suzanne Donaldson is a 63 y.o. female.  Patient presents with 2 to 3-day history of ear pain, sore throat, cough.  No fever, chest pain, or shortness of breath.  Patient states one of her grandchildren has strep throat and she would like to be tested.  She also presents today with bilateral eye redness, itching, drainage, crusting in her lashes.  The drainage is yellow-green.  No eye injury, eye pain, change in vision.  Patient also request a refill of the Skelaxin  that she was prescribed on 07/10/2023 following a rib fracture.  Patient was seen at this urgent care on 06/27/2023; diagnosed with sinusitis; treated with doxycycline .  She was seen at this urgent care again on 07/10/2023; diagnosed with left rib pain and closed fracture of multiple ribs on left side; treated with topical Voltaren  and Skelaxin .  The history is provided by the patient and medical records.    Past Medical History:  Diagnosis Date   Allergy    Bursitis    Bursitis    right wrist   Cataract    removed by surgery - bilateral   Hypercholesteremia    Hypertension     Patient Active Problem List   Diagnosis Date Noted   Coronary artery disease involving native coronary artery of native heart without angina pectoris 07/08/2018   Essential hypertension 07/08/2018   Pure hypercholesterolemia 07/08/2018    Past Surgical History:  Procedure Laterality Date   ANAL FISTULOTOMY     CARDIAC CATHETERIZATION     CATARACT EXTRACTION Bilateral    EXPLORATORY LAPAROTOMY     EYE SURGERY Left    floaters - laser   wisdom teeth ext      OB History   No obstetric history on file.      Home Medications    Prior to Admission medications   Medication Sig Start Date End Date Taking? Authorizing Provider  metaxalone  (SKELAXIN ) 800 MG tablet  Take 1 tablet (800 mg total) by mouth 3 (three) times daily. 08/05/23  Yes Wellington Half, NP  trimethoprim-polymyxin b (POLYTRIM) ophthalmic solution Place 1 drop into both eyes 4 (four) times daily for 7 days. 08/05/23 08/12/23 Yes Wellington Half, NP  atorvastatin  (LIPITOR) 80 MG tablet Take 1 tablet by mouth once daily 06/28/22   Lasalle Pointer, NP  chlorthalidone (HYGROTON) 25 MG tablet Take 12.5 mg by mouth daily.    [provider]  diclofenac  Sodium (VOLTAREN ) 1 % GEL Apply 2 g topically 4 (four) times daily. 07/10/23   Reena Canning, NP  diltiazem (CARDIZEM CD) 180 MG 24 hr capsule Take 180 mg by mouth daily. 09/27/19   [provider]  doxycycline  (VIBRAMYCIN ) 100 MG capsule Take 1 capsule (100 mg total) by mouth 2 (two) times daily. 06/27/23   Sofia, Leslie K, PA-C  furosemide (LASIX) 20 MG tablet Take 20 mg by mouth.    [provider]  lisinopril (ZESTRIL) 20 MG tablet Take 20 mg by mouth daily. 08/20/19   [provider]  loratadine (CLARITIN) 10 MG tablet Take 10 mg by mouth daily.    [provider]  nebivolol (BYSTOLIC) 10 MG tablet Take 10 mg by mouth daily. 03/10/20   [provider]  nitroGLYCERIN (NITROSTAT) 0.4 MG SL tablet  Place 0.4 mg under the tongue every 5 (five) minutes as needed for chest pain.    [provider]  Olopatadine  HCl 0.2 % SOLN Apply 1 drop to eye daily. 01/09/23   White, Maybelle Spatz, NP  potassium chloride SA (KLOR-CON M) 20 MEQ tablet Take 20 mEq by mouth daily. 06/18/22   [provider]  topiramate (TOPAMAX) 50 MG tablet Take 50 mg by mouth daily.    [provider]  UNABLE TO FIND Allergy injections- 2 injection q weekly given by [facility]    [provider]  Vitamin D, Ergocalciferol, (DRISDOL) 50000 units CAPS capsule Take 1 capsule by mouth once a week. 05/07/17   [provider]    Family History Family History  Problem Relation Age of Onset   Heart disease  Mother    Hypertension Mother    Heart disease Father    Hypertension Father    Diabetes Father    Heart disease Sister    Hypertension Sister    Diabetes Sister    Heart attack Maternal Grandfather    Colon cancer Neg Hx    Breast cancer Neg Hx    Mental illness Neg Hx    Rectal cancer Neg Hx    Stomach cancer Neg Hx     Social History Social History   Tobacco Use   Smoking status: Never   Smokeless tobacco: Never  Vaping Use   Vaping status: Never Used  Substance Use Topics   Alcohol use: No   Drug use: No     Allergies   Levofloxacin, Sulfur dioxide, Aspirin, Biaxin [clarithromycin], Penicillins, Ceftin [cefuroxime axetil], Cortisone, Cortizone-10 [hydrocortisone], Prednisone, and Sulfa antibiotics   Review of Systems Review of Systems  Constitutional:  Negative for chills and fever.  HENT:  Positive for ear pain and sore throat. Negative for ear discharge, trouble swallowing and voice change.   Eyes:  Positive for discharge, redness and itching. Negative for pain and visual disturbance.  Respiratory:  Positive for cough. Negative for shortness of breath.   Cardiovascular:  Negative for chest pain and palpitations.     Physical Exam Triage Vital Signs ED Triage Vitals  Encounter Vitals Group     BP      Systolic BP Percentile      Diastolic BP Percentile      Pulse      Resp      Temp      Temp src      SpO2      Weight      Height      Head Circumference      Peak Flow      Pain Score      Pain Loc      Pain Education      Exclude from Growth Chart    No data found.  Updated Vital Signs BP 117/74 (BP Location: Left Arm)   Pulse 60   Temp 98.8 F (37.1 C) (Oral)   Resp 18   LMP  (LMP Unknown)   SpO2 96%   Visual Acuity Right Eye Distance:   Left Eye Distance:   Bilateral Distance:    Right Eye Near:   Left Eye Near:    Bilateral Near:     Physical Exam Constitutional:      General: She is not in acute distress. HENT:      Right Ear: Tympanic membrane normal.     Left Ear: Tympanic membrane normal.  Nose: Nose normal.     Mouth/Throat:     Mouth: Mucous membranes are moist.     Pharynx: Oropharynx is clear.  Eyes:     General: Lids are normal. Vision grossly intact.     Extraocular Movements: Extraocular movements intact.     Conjunctiva/sclera:     Right eye: Right conjunctiva is injected.     Left eye: Left conjunctiva is injected.     Pupils: Pupils are equal, round, and reactive to light.  Cardiovascular:     Rate and Rhythm: Normal rate and regular rhythm.     Heart sounds: Normal heart sounds.  Pulmonary:     Effort: Pulmonary effort is normal. No respiratory distress.     Breath sounds: Normal breath sounds.  Neurological:     Mental Status: She is alert.      UC Treatments / Results  Labs (all labs ordered are listed, but only abnormal results are displayed) Labs Reviewed  POCT RAPID STREP A (OFFICE)    EKG   Radiology No results found.  Procedures Procedures (including critical care time)  Medications Ordered in UC Medications - No data to display  Initial Impression / Assessment and Plan / UC Course  I have reviewed the triage vital signs and the nursing notes.  Pertinent labs & imaging results that were available during my care of the patient were reviewed by me and considered in my medical decision making (see chart for details).    Viral pharyngitis, viral URI, bacterial conjunctivitis, medication refill.  Afebrile and vital signs are stable.  Lungs are clear and O2 sat is 96% on room air.  Patient declines chest x-ray.  Per patient request, rapid strep done and is negative.  Discussed symptomatic treatment and education provided on viral pharyngitis and viral respiratory infection.  10 tablets of Skelaxin  sent to patient's pharmacy per her request for a refill.  Discussed that she will need to get additional refills from her PCP if needed.  She has an appointment with  her PCP on 08/26/2023.  Polytrim eyedrops sent in for bacterial conjunctivitis.  Education provided on bacterial conjunctivitis.  Instructed patient to follow-up with her PCP sooner if she is not improving.  ED precautions given.  She agrees to plan of care.   Final Clinical Impressions(s) / UC Diagnoses   Final diagnoses:  Viral pharyngitis  Viral URI  Bacterial conjunctivitis of both eyes  Medication refill     Discharge Instructions      Use the eyedrops as directed.  See the attached handout on bacterial conjunctivitis.    A small refill of the muscle relaxer was sent to your pharmacy.  Please see your primary care provider for additional refills if needed.  Do not drive, operate machinery, drink alcohol, or perform dangerous activities while taking this medication as it may cause drowsiness.  Your strep test is negative.  See the attached information on pharyngitis and viral respiratory infection.    Follow-up with your primary care provider.  Go to the emergency department if you have worsening symptoms.      ED Prescriptions     Medication Sig Dispense Auth. Provider   trimethoprim-polymyxin b (POLYTRIM) ophthalmic solution Place 1 drop into both eyes 4 (four) times daily for 7 days. 10 mL Wellington Half, NP   metaxalone  (SKELAXIN ) 800 MG tablet Take 1 tablet (800 mg total) by mouth 3 (three) times daily. 10 tablet Wellington Half, NP      PDMP  not reviewed this encounter.   Wellington Half, NP 08/05/23 [phone]

## 2023-08-05 NOTE — ED Triage Notes (Signed)
 Patient reports cough with green sputum x 3 days. Also reports scratchy throat that started today. Left ear pain x 2 days. Taken cough meds  with  no relief. Patient reports sick grandchildren at home. Patient reports pain when she coughs. Rate 5/10.

## 2023-11-30 ENCOUNTER — Ambulatory Visit (HOSPITAL_COMMUNITY): Admission: EM | Admit: 2023-11-30 | Discharge: 2023-11-30 | Disposition: A | Discharge status: Home / Self Care

## 2023-11-30 ENCOUNTER — Encounter (HOSPITAL_COMMUNITY): Payer: Self-pay

## 2023-11-30 DIAGNOSIS — S5002XA Contusion of left elbow, initial encounter: Secondary | ICD-10-CM

## 2023-11-30 NOTE — ED Provider Notes (Signed)
 [facility]    CSN: [id number] Arrival date & time: 11/30/23  1512      History   Chief Complaint Chief Complaint  Patient presents with   Arm Pain    HPI Suzanne Donaldson is a 63 y.o. female.   Patient presents today due to injury to left elbow 5 days ago.  Patient states that she was in her room and hit her right elbow against the door frame in her closet.  Patient states that she is experiencing some mild bruising of her left elbow and 5/10 pain.  Patient states that she is also experiencing some mild weakness in her left forearm.  Patient states that she has been taking 1000 mg Tylenol  only at night with no significant relief of pain.  Patient denies use of anything otherwise for pain.   Arm Pain    Past Medical History:  Diagnosis Date   Allergy    Bursitis    Bursitis    right wrist   Cataract    removed by surgery - bilateral   Hypercholesteremia    Hypertension     Patient Active Problem List   Diagnosis Date Noted   Coronary artery disease involving native coronary artery of native heart without angina pectoris 07/08/2018   Essential hypertension 07/08/2018   Pure hypercholesterolemia 07/08/2018    Past Surgical History:  Procedure Laterality Date   ANAL FISTULOTOMY     CARDIAC CATHETERIZATION     CATARACT EXTRACTION Bilateral    EXPLORATORY LAPAROTOMY     EYE SURGERY Left    floaters - laser   wisdom teeth ext      OB History   No obstetric history on file.      Home Medications    Prior to Admission medications   Medication Sig Start Date End Date Taking? Authorizing Provider  atorvastatin  (LIPITOR) 80 MG tablet Take 1 tablet by mouth once daily 06/28/22   Miriam Norris, NP  chlorthalidone (HYGROTON) 25 MG tablet Take 12.5 mg by mouth daily.    [provider]  diclofenac  Sodium (VOLTAREN ) 1 % GEL Apply 2 g topically 4 (four) times daily. Patient not taking: Reported on 11/30/2023 07/10/23   Teresa Shelba SAUNDERS, NP   diltiazem (CARDIZEM CD) 180 MG 24 hr capsule Take 180 mg by mouth daily. 09/27/19   [provider]  doxycycline  (VIBRAMYCIN ) 100 MG capsule Take 1 capsule (100 mg total) by mouth 2 (two) times daily. Patient not taking: Reported on 11/30/2023 06/27/23   Sofia, Leslie K, PA-C  furosemide (LASIX) 20 MG tablet Take 20 mg by mouth.    [provider]  lisinopril (ZESTRIL) 20 MG tablet Take 20 mg by mouth daily. 08/20/19   [provider]  loratadine (CLARITIN) 10 MG tablet Take 10 mg by mouth daily.    [provider]  metaxalone  (SKELAXIN ) 800 MG tablet Take 1 tablet (800 mg total) by mouth 3 (three) times daily. 08/05/23   Corlis Burnard DEL, NP  nebivolol (BYSTOLIC) 10 MG tablet Take 10 mg by mouth daily. 03/10/20   [provider]  nitroGLYCERIN (NITROSTAT) 0.4 MG SL tablet Place 0.4 mg under the tongue every 5 (five) minutes as needed for chest pain.    [provider]  Olopatadine  HCl 0.2 % SOLN Apply 1 drop to eye daily. Patient not taking: Reported on 11/30/2023 01/09/23   Teresa Shelba SAUNDERS, NP  potassium chloride SA (KLOR-CON M) 20 MEQ tablet Take 20 mEq by mouth daily. Patient  not taking: Reported on 11/30/2023 06/18/22   [provider]  topiramate (TOPAMAX) 50 MG tablet Take 50 mg by mouth daily.    [provider]  UNABLE TO FIND Allergy injections- 2 injection q weekly given by [facility]    [provider]  Vitamin D, Ergocalciferol, (DRISDOL) 50000 units CAPS capsule Take 1 capsule by mouth once a week. 05/07/17   [provider]    Family History Family History  Problem Relation Age of Onset   Heart disease Mother    Hypertension Mother    Heart disease Father    Hypertension Father    Diabetes Father    Heart disease Sister    Hypertension Sister    Diabetes Sister    Heart attack Maternal Grandfather    Colon cancer Neg Hx    Breast cancer Neg Hx    Mental illness Neg Hx    Rectal cancer Neg  Hx    Stomach cancer Neg Hx     Social History Social History   Tobacco Use   Smoking status: Never   Smokeless tobacco: Never  Vaping Use   Vaping status: Never Used  Substance Use Topics   Alcohol use: No   Drug use: No     Allergies   Levofloxacin, Sulfur dioxide, Aspirin, Biaxin [clarithromycin], Penicillins, Ceftin [cefuroxime axetil], Cortisone, Cortizone-10 [hydrocortisone], Prednisone, and Sulfa antibiotics   Review of Systems Review of Systems   Physical Exam Triage Vital Signs ED Triage Vitals [11/30/23 1605]  Encounter Vitals Group     BP 129/70     Girls Systolic BP Percentile      Girls Diastolic BP Percentile      Boys Systolic BP Percentile      Boys Diastolic BP Percentile      Pulse Rate 66     Resp 14     Temp 98.5 F (36.9 C)     Temp Source Oral     SpO2 100 %     Weight      Height      Head Circumference      Peak Flow      Pain Score 5     Pain Loc      Pain Education      Exclude from Growth Chart    No data found.  Updated Vital Signs BP 129/70 (BP Location: Right Arm)   Pulse 66   Temp 98.5 F (36.9 C) (Oral)   Resp 14   LMP  (LMP Unknown)   SpO2 100%   Visual Acuity Right Eye Distance:   Left Eye Distance:   Bilateral Distance:    Right Eye Near:   Left Eye Near:    Bilateral Near:     Physical Exam Vitals and nursing note reviewed.  Constitutional:      General: She is not in acute distress.    Appearance: Normal appearance. She is not ill-appearing, toxic-appearing or diaphoretic.  Eyes:     General: No scleral icterus. Cardiovascular:     Rate and Rhythm: Normal rate and regular rhythm.     Heart sounds: Normal heart sounds.  Pulmonary:     Effort: Pulmonary effort is normal. No respiratory distress.     Breath sounds: Normal breath sounds. No wheezing or rhonchi.  Musculoskeletal:     Left elbow: No swelling, deformity, effusion or lacerations. Normal range of motion. Tenderness present in olecranon  process.     Comments: Mild tenderness to palpation  of left elbow, small area of blackish ecchymosis  Skin:    General: Skin is warm.  Neurological:     Mental Status: She is alert and oriented to person, place, and time.  Psychiatric:        Mood and Affect: Mood normal.        Behavior: Behavior normal.      UC Treatments / Results  Labs (all labs ordered are listed, but only abnormal results are displayed) Labs Reviewed - No data to display  EKG   Radiology No results found.  Procedures Procedures (including critical care time)  Medications Ordered in UC Medications - No data to display  Initial Impression / Assessment and Plan / UC Course  I have reviewed the triage vital signs and the nursing notes.  Pertinent labs & imaging results that were available during my care of the patient were reviewed by me and considered in my medical decision making (see chart for details).     Contusion of left elbow-patient is range of motion of the left elbow, given supportive treatments and advised if her symptoms do not improve she has to follow-up with orthopedics Final Clinical Impressions(s) / UC Diagnoses   Final diagnoses:  Contusion of left elbow, initial encounter     Discharge Instructions      You should take 1000 mg of Tylenol  every 6 hours as needed for pain.  You may not take more than 4000 mg of Tylenol  in a 24-hour period as it will hurt your liver.  Today you have been diagnosed with a musculoskeletal injury.  Adults may use 400 mg of ibuprofen and 1000 mg of Tylenol  together every 8 hours as needed for pain control.  Children may take ibuprofen and Tylenol  as directed on medication packaging.  You should use ice on affected area for 20 minutes at a time a couple times a day for the first 24 hours then you may switch to heat in the same intervals.  Be sure to put a barrier between ice or heat source and skin to prevent burns.  May also wrap affected area and Ace  bandage if tolerated and appropriate, and elevate above the level of the heart to help reduce swelling.  Do not wrap Ace bandages around neck or torso as wrapping too tight can restrict air movement inability to breathe.  If symptoms do not seem to be improving in 3 to 5 days after following these instructions we need to follow-up with orthopedist or PCP.     ED Prescriptions   None    PDMP not reviewed this encounter.   Andra Corean BROCKS, PA-C 11/30/23 1711

## 2023-11-30 NOTE — ED Triage Notes (Signed)
 Patient states she has left forearm pain and left elbow pain x 5 days. Patient states she was cleaning her house and states she was carrying a box and hit the corner of a door with her left elbow. Patient has a small bruise to the left elbow area. Patient states her left forearm feels stiff.  Patient reports that she has been taking Tylenol  for her pain.

## 2023-11-30 NOTE — Discharge Instructions (Signed)
 You should take 1000 mg of Tylenol  every 6 hours as needed for pain.  You may not take more than 4000 mg of Tylenol  in a 24-hour period as it will hurt your liver.  Today you have been diagnosed with a musculoskeletal injury.  Adults may use 400 mg of ibuprofen and 1000 mg of Tylenol  together every 8 hours as needed for pain control.  Children may take ibuprofen and Tylenol  as directed on medication packaging.  You should use ice on affected area for 20 minutes at a time a couple times a day for the first 24 hours then you may switch to heat in the same intervals.  Be sure to put a barrier between ice or heat source and skin to prevent burns.  May also wrap affected area and Ace bandage if tolerated and appropriate, and elevate above the level of the heart to help reduce swelling.  Do not wrap Ace bandages around neck or torso as wrapping too tight can restrict air movement inability to breathe.  If symptoms do not seem to be improving in 3 to 5 days after following these instructions we need to follow-up with orthopedist or PCP.

## 2023-12-08 ENCOUNTER — Other Ambulatory Visit: Payer: Self-pay | Admitting: Family Medicine

## 2023-12-08 DIAGNOSIS — Z1231 Encounter for screening mammogram for malignant neoplasm of breast: Secondary | ICD-10-CM

## 2023-12-29 ENCOUNTER — Telehealth: Payer: Self-pay | Admitting: Cardiology

## 2023-12-29 NOTE — Telephone Encounter (Signed)
 Paper Work Dropped Off: at front desk   Date:12/29/2023  Location of paper: in [employer]

## 2023-12-30 NOTE — Telephone Encounter (Signed)
 Received paperwork.  Completed what could be completed as pt has not been seen here since 06/2022.  Given to Dr Jeffrie to sign.

## 2024-01-01 NOTE — Telephone Encounter (Signed)
 Pt is calling checking status of paperwork. Please advise

## 2024-01-01 NOTE — Telephone Encounter (Signed)
 Paperwork filled out by Dr Jeffrie as requested.  Sent to MR to include medical records requested.

## 2024-01-06 ENCOUNTER — Ambulatory Visit
Admission: RE | Admit: 2024-01-06 | Discharge: 2024-01-06 | Disposition: A | Discharge status: Home / Self Care | Source: Ambulatory Visit | Attending: Family Medicine | Admitting: Family Medicine

## 2024-01-06 DIAGNOSIS — Z1231 Encounter for screening mammogram for malignant neoplasm of breast: Secondary | ICD-10-CM | POA: Diagnosis present

## 2024-01-28 ENCOUNTER — Ambulatory Visit: Admission: EM | Admit: 2024-01-28 | Discharge: 2024-01-28 | Disposition: A | Discharge status: Home / Self Care

## 2024-01-28 DIAGNOSIS — J069 Acute upper respiratory infection, unspecified: Secondary | ICD-10-CM

## 2024-01-28 MED ORDER — DOXYCYCLINE HYCLATE 100 MG PO CAPS: 100.0000 mg | ORAL_CAPSULE | Freq: Two times a day (BID) | ORAL | 0 refills | Status: AC

## 2024-01-28 NOTE — ED Triage Notes (Signed)
 Patient to Urgent Care with complaints of a productive cough/ nasal congestion/ sneezing/ feeling run down.  Symptoms x5 days. Possible fever during the night. Hx of pneumonia reports concerns about this turning into the same.   Taking daily allergy pill.

## 2024-01-28 NOTE — Discharge Instructions (Addendum)
Take the doxycycline as directed.  Follow up with your primary care provider.

## 2024-01-28 NOTE — ED Provider Notes (Signed)
 Suzanne Donaldson    CSN: [id number] Arrival date & time: 01/28/24  0905      History   Chief Complaint Chief Complaint  Patient presents with   Cough    HPI Suzanne Donaldson is a 63 y.o. female.  Patient presents with 5-day history of cough productive of yellow sputum, congestion, sneezing.  No fever, shortness of breath, vomiting, diarrhea.  Treatment attempted with her daily allergy medication loratadine.  No OTC medications taken today; Tylenol  taken yesterday.  Patient reports history of pneumonia in July 2025.  The history is provided by the patient and medical records.    Past Medical History:  Diagnosis Date   Allergy    Bursitis    Bursitis    right wrist   Cataract    removed by surgery - bilateral   Hypercholesteremia    Hypertension     Patient Active Problem List   Diagnosis Date Noted   Coronary artery disease involving native coronary artery of native heart without angina pectoris 07/08/2018   Essential hypertension 07/08/2018   Pure hypercholesterolemia 07/08/2018    Past Surgical History:  Procedure Laterality Date   ANAL FISTULOTOMY     CARDIAC CATHETERIZATION     CATARACT EXTRACTION Bilateral    EXPLORATORY LAPAROTOMY     EYE SURGERY Left    floaters - laser   wisdom teeth ext      OB History   No obstetric history on file.      Home Medications    Prior to Admission medications   Medication Sig Start Date End Date Taking? Authorizing Provider  albuterol (VENTOLIN HFA) 108 (90 Base) MCG/ACT inhaler Inhale 2 puffs into the lungs. 01/07/24 01/06/25 Yes [provider]  doxycycline  (VIBRAMYCIN ) 100 MG capsule Take 1 capsule (100 mg total) by mouth 2 (two) times daily for 10 days. 01/28/24 02/07/24 Yes Corlis Burnard DEL, NP  atorvastatin  (LIPITOR) 80 MG tablet Take 1 tablet by mouth once daily 06/28/22   Miriam Norris, NP  chlorthalidone (HYGROTON) 25 MG tablet Take 12.5 mg by mouth daily.    [provider]  diclofenac   Sodium (VOLTAREN ) 1 % GEL Apply 2 g topically 4 (four) times daily. Patient not taking: Reported on 11/30/2023 07/10/23   Teresa Shelba SAUNDERS, NP  diltiazem (CARDIZEM CD) 180 MG 24 hr capsule Take 180 mg by mouth daily. 09/27/19   [provider]  furosemide (LASIX) 20 MG tablet Take 20 mg by mouth.    [provider]  lisinopril (ZESTRIL) 20 MG tablet Take 20 mg by mouth daily. 08/20/19   [provider]  loratadine (CLARITIN) 10 MG tablet Take 10 mg by mouth daily.    [provider]  metaxalone  (SKELAXIN ) 800 MG tablet Take 1 tablet (800 mg total) by mouth 3 (three) times daily. 08/05/23   Corlis Burnard DEL, NP  nebivolol (BYSTOLIC) 10 MG tablet Take 10 mg by mouth daily. 03/10/20   [provider]  nitroGLYCERIN (NITROSTAT) 0.4 MG SL tablet Place 0.4 mg under the tongue every 5 (five) minutes as needed for chest pain.    [provider]  Olopatadine  HCl 0.2 % SOLN Apply 1 drop to eye daily. Patient not taking: Reported on 11/30/2023 01/09/23   Teresa Shelba SAUNDERS, NP  potassium chloride SA (KLOR-CON M) 20 MEQ tablet Take 20 mEq by mouth daily. Patient not taking: Reported on 11/30/2023 06/18/22   [provider]  topiramate (TOPAMAX) 50 MG tablet Take 50 mg by mouth daily.  [provider]  UNABLE TO FIND Allergy injections- 2 injection q weekly given by [facility]    [provider]  Vitamin D, Ergocalciferol, (DRISDOL) 50000 units CAPS capsule Take 1 capsule by mouth once a week. 05/07/17   [provider]    Family History Family History  Problem Relation Age of Onset   Heart disease Mother    Hypertension Mother    Heart disease Father    Hypertension Father    Diabetes Father    Heart disease Sister    Hypertension Sister    Diabetes Sister    Heart attack Maternal Grandfather    Colon cancer Neg Hx    Breast cancer Neg Hx    Mental illness Neg Hx    Rectal cancer Neg Hx    Stomach cancer Neg Hx      Social History Social History   Tobacco Use   Smoking status: Never   Smokeless tobacco: Never  Vaping Use   Vaping status: Never Used  Substance Use Topics   Alcohol use: No   Drug use: No     Allergies   Levofloxacin, Sulfur dioxide, Aspirin, Biaxin [clarithromycin], Penicillins, Ceftin [cefuroxime axetil], Cortisone, Cortizone-10 [hydrocortisone], Prednisone, and Sulfa antibiotics   Review of Systems Review of Systems  Constitutional:  Negative for chills and fever.  HENT:  Positive for congestion and sneezing. Negative for ear pain and sore throat.   Respiratory:  Positive for cough. Negative for shortness of breath.   Cardiovascular:  Negative for chest pain and palpitations.  Gastrointestinal:  Negative for diarrhea and vomiting.     Physical Exam Triage Vital Signs ED Triage Vitals  Encounter Vitals Group     BP 01/28/24 0923 100/65     Girls Systolic BP Percentile --      Girls Diastolic BP Percentile --      Boys Systolic BP Percentile --      Boys Diastolic BP Percentile --      Pulse Rate 01/28/24 0923 66     Resp 01/28/24 0923 18     Temp 01/28/24 0923 98.2 F (36.8 C)     Temp src --      SpO2 01/28/24 0923 95 %     Weight --      Height --      Head Circumference --      Peak Flow --      Pain Score 01/28/24 0927 0     Pain Loc --      Pain Education --      Exclude from Growth Chart --    No data found.  Updated Vital Signs BP 100/65   Pulse 66   Temp 98.2 F (36.8 C)   Resp 18   LMP  (LMP Unknown)   SpO2 95%   Visual Acuity Right Eye Distance:   Left Eye Distance:   Bilateral Distance:    Right Eye Near:   Left Eye Near:    Bilateral Near:     Physical Exam Constitutional:      General: She is not in acute distress. HENT:     Right Ear: Tympanic membrane normal.     Left Ear: Tympanic membrane normal.     Nose: Congestion present.     Mouth/Throat:     Mouth: Mucous membranes are moist.     Pharynx: Oropharynx is  clear.  Cardiovascular:     Rate and Rhythm: Normal rate and regular rhythm.  Heart sounds: Normal heart sounds.  Pulmonary:     Effort: Pulmonary effort is normal. No respiratory distress.     Breath sounds: Normal breath sounds.  Neurological:     Mental Status: She is alert.      UC Treatments / Results  Labs (all labs ordered are listed, but only abnormal results are displayed) Labs Reviewed - No data to display  EKG   Radiology No results found.  Procedures Procedures (including critical care time)  Medications Ordered in UC Medications - No data to display  Initial Impression / Assessment and Plan / UC Course  I have reviewed the triage vital signs and the nursing notes.  Pertinent labs & imaging results that were available during my care of the patient were reviewed by me and considered in my medical decision making (see chart for details).    Acute upper respiratory infection.  Afebrile and vital signs are stable.  Lungs are clear at this time and O2 sat is 95% on room air.  Patient has history of pneumonia in July of this year.  She reports her current cough is productive of yellow sputum.  Treating today with doxycycline .  Education provided on upper respiratory infection.  Instructed patient to follow-up with her PCP.  ED precautions given.  Patient agrees to plan of care.  Final Clinical Impressions(s) / UC Diagnoses   Final diagnoses:  Acute upper respiratory infection     Discharge Instructions      Take the doxycycline  as directed.  Follow up with your primary care provider.        ED Prescriptions     Medication Sig Dispense Auth. Provider   doxycycline  (VIBRAMYCIN ) 100 MG capsule Take 1 capsule (100 mg total) by mouth 2 (two) times daily for 10 days. 20 capsule Corlis Burnard DEL, NP      PDMP not reviewed this encounter.   Corlis Burnard DEL, NP 01/28/24 [phone]

## 2024-01-30 IMAGING — MG MM DIGITAL SCREENING BILAT W/ TOMO AND CAD
8 series · 8 of 24 positions shown · non-contrast
Comparison: Previous exam(s).

CLINICAL DATA: Screening.

EXAM:
DIGITAL SCREENING BILATERAL MAMMOGRAM WITH TOMOSYNTHESIS AND CAD
TECHNIQUE: Bilateral screening digital craniocaudal and mediolateral oblique
mammograms were obtained. Bilateral screening digital breast
tomosynthesis was performed. The images were evaluated with
computer-aided detection.

[R CC synth-2D]
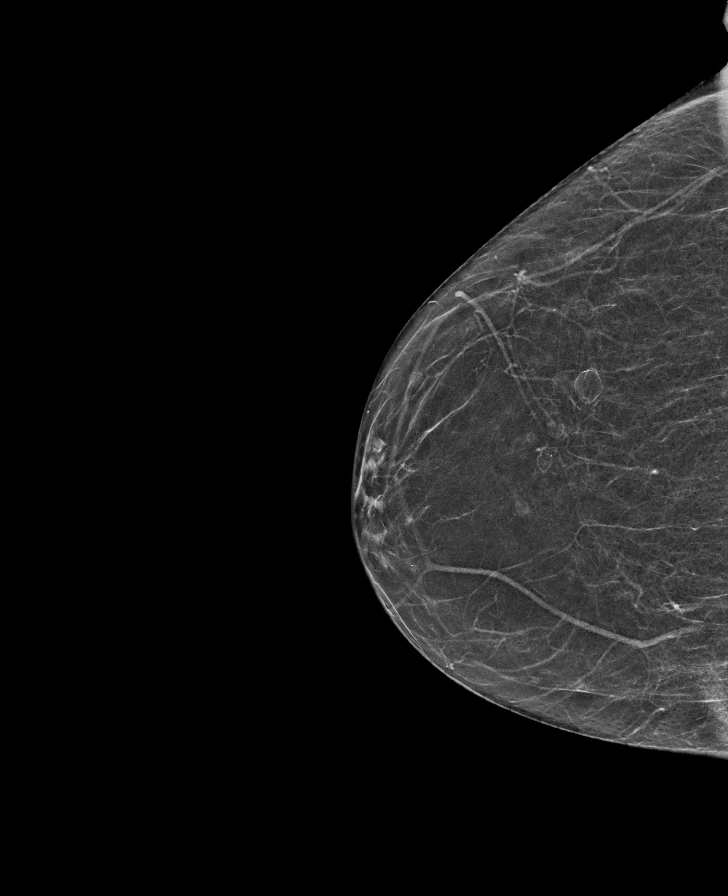

[L MLO synth-2D]
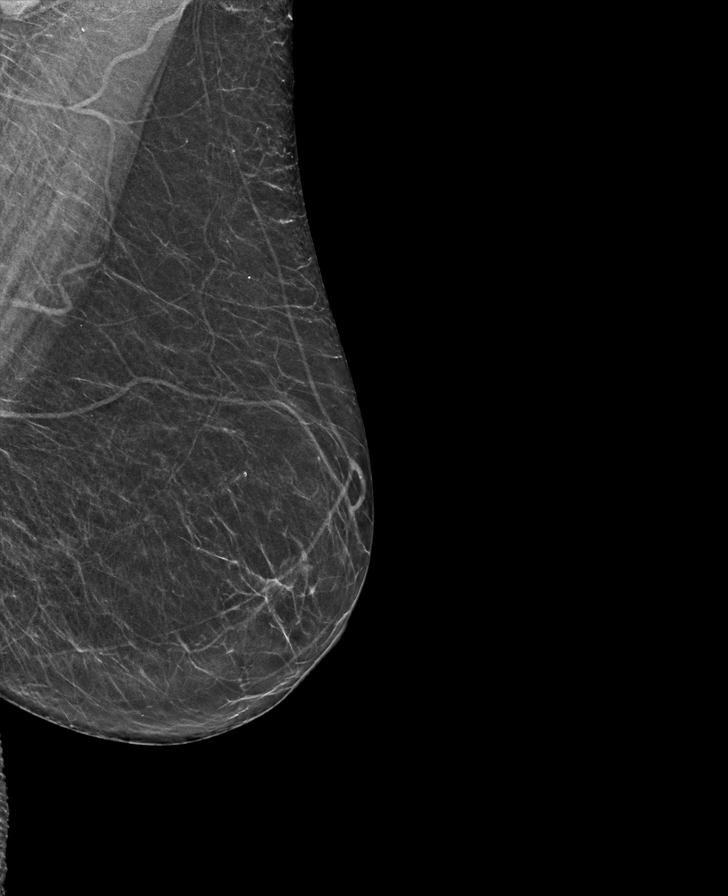

[L CC synth-2D]
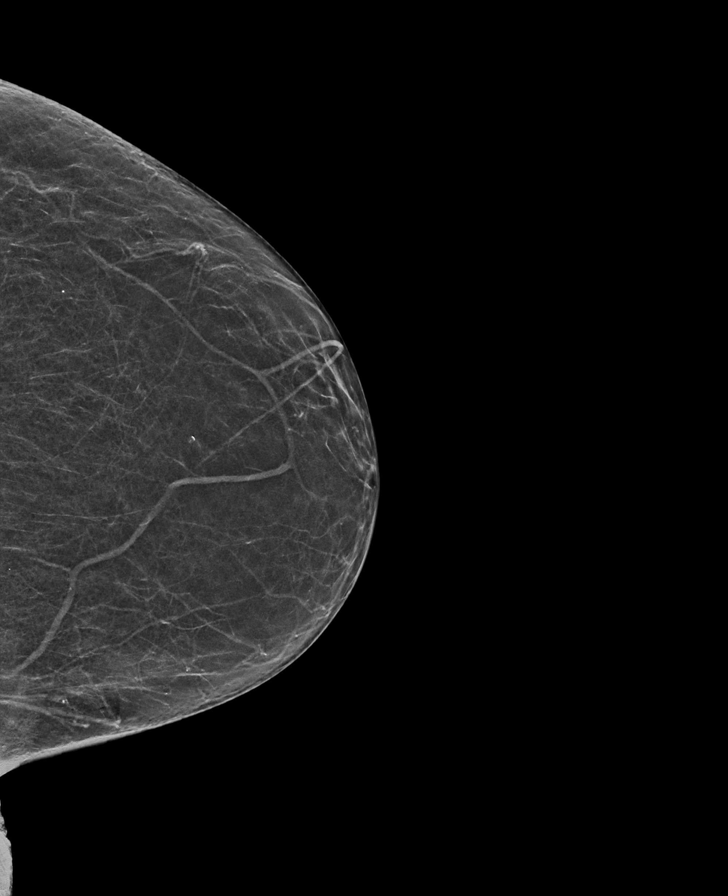

[R MLO synth-2D]
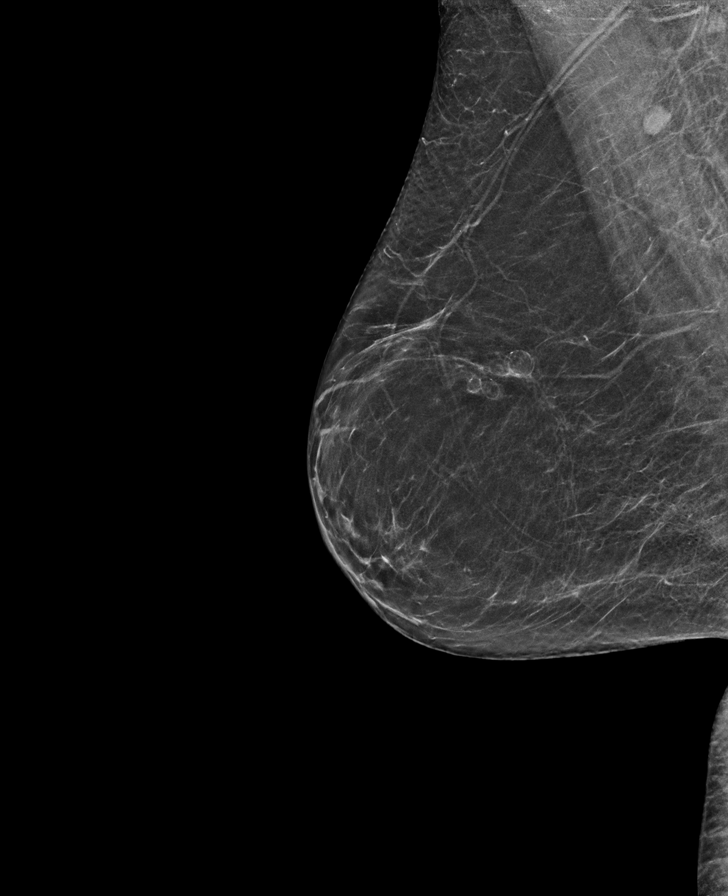

[R MLO tomo · tomo slice 32/63.0]
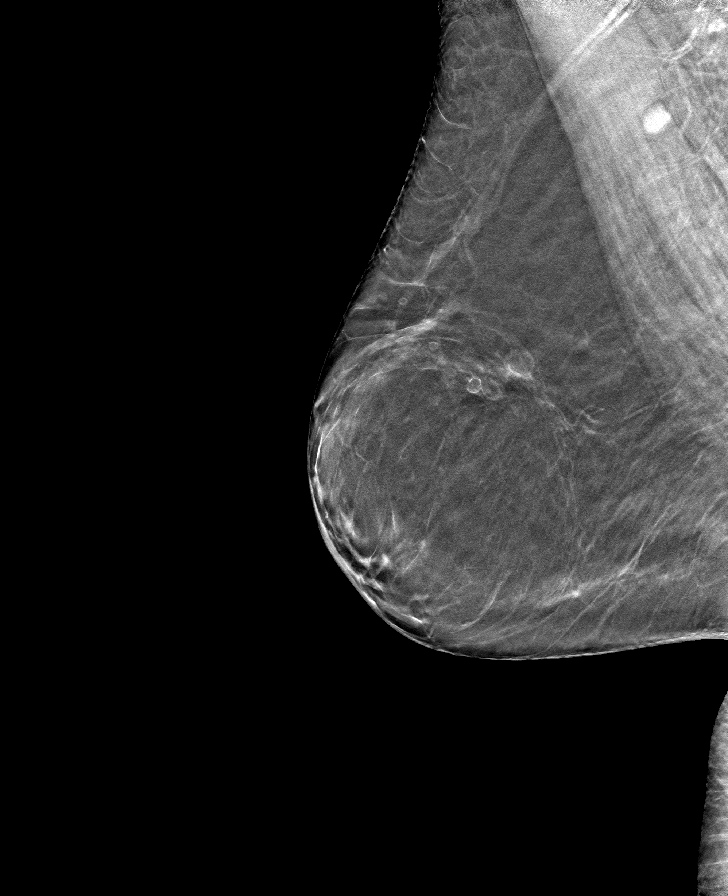

[R CC tomo · tomo slice 29/56.0]
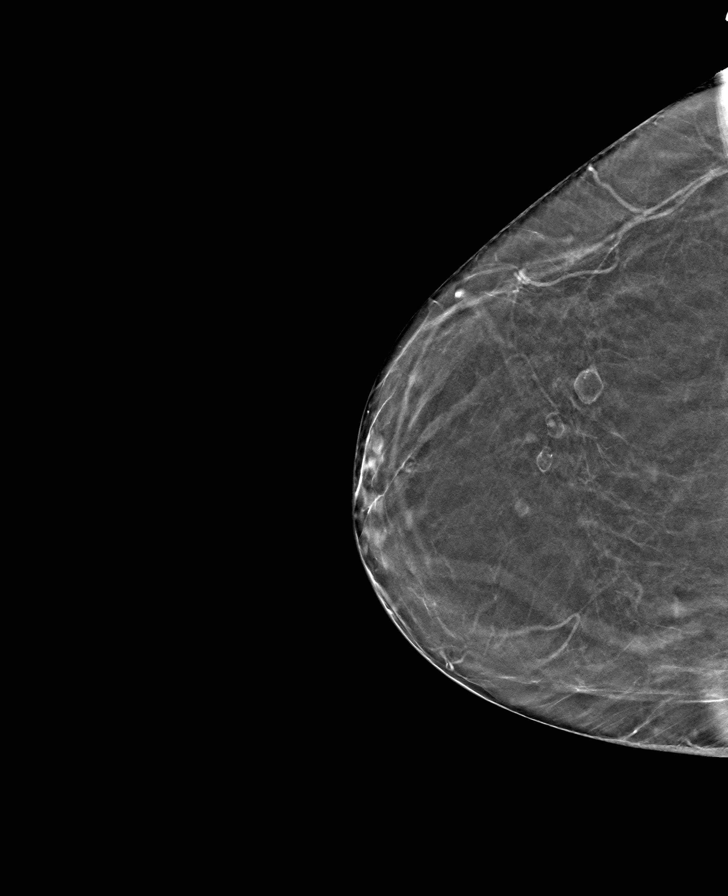

[L MLO tomo · tomo slice 31/60.0]
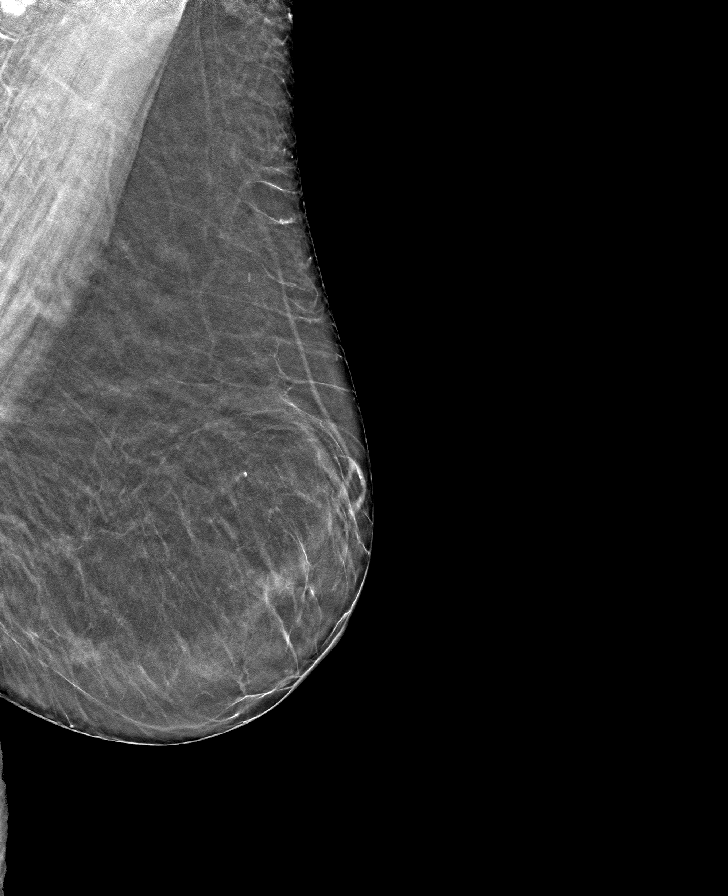

[L CC tomo · tomo slice 27/54.0]
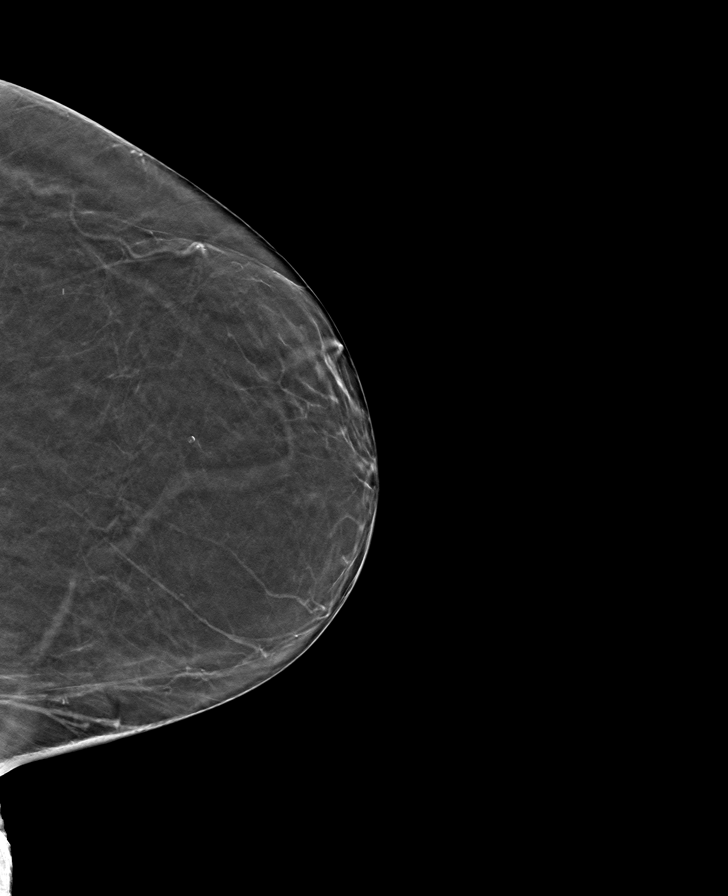

[8 of 24 positions shown; findings below may reference images not displayed]

ACR Breast Density Category b: There are scattered areas of
fibroglandular density.
FINDINGS: There are no findings suspicious for malignancy.
IMPRESSION: No mammographic evidence of malignancy. A result letter of this
screening mammogram will be mailed directly to the patient.

RECOMMENDATION:
Screening mammogram in one year. (Code:51-O-LD2)

BI-RADS CATEGORY  1: Negative.

## 2024-02-23 ENCOUNTER — Ambulatory Visit
Admission: EM | Admit: 2024-02-23 | Discharge: 2024-02-23 | Disposition: A | Discharge status: Home / Self Care | Attending: Emergency Medicine | Admitting: Emergency Medicine

## 2024-02-23 ENCOUNTER — Encounter: Payer: Self-pay | Admitting: Emergency Medicine

## 2024-02-23 DIAGNOSIS — J069 Acute upper respiratory infection, unspecified: Secondary | ICD-10-CM | POA: Diagnosis not present

## 2024-02-23 LAB — POCT INFLUENZA A/B
Influenza A, POC: NEGATIVE
Influenza B, POC: NEGATIVE

## 2024-02-23 MED ORDER — DOXYCYCLINE HYCLATE 100 MG PO CAPS: 100.0000 mg | ORAL_CAPSULE | Freq: Two times a day (BID) | ORAL | 0 refills | Status: DC

## 2024-02-23 MED ORDER — PROMETHAZINE-DM 6.25-15 MG/5ML PO SYRP: 5.0000 mL | ORAL_SOLUTION | Freq: Every evening | ORAL | 0 refills | Status: AC | PRN

## 2024-02-23 NOTE — ED Triage Notes (Signed)
 Patient was seen here on 01-28-24 for URI. Patient now complains of headache, cough and fever that started 3 days ago. Patient had a fever 100.0 this am. Patient has taken Tylenol  for symptoms with mild improvement. Rates headache pain 5/10.

## 2024-02-23 NOTE — ED Provider Notes (Signed)
 " Suzanne Donaldson    CSN: [id number] Arrival date & time: 02/23/24  1012      History   Chief Complaint Chief Complaint  Patient presents with   Headache   Cough   Fever    HPI Suzanne Donaldson is a 63 y.o. female.   Patient presents for evaluation of persisting nasal congestion and a productive cough with discolored sputum present for 30 days, over the past 3 days symptoms have worsened and become more prominent, experiencing shortness of breath with exertion.  Had low-grade fever peaking at 100 today.  c associated scratchy throat frontal headache and sinus pressure behind the right eye.  Known exposure to flu over the holiday.  Has attempted use of albuterol inhaler, Tylenol  and Mucinex.  Non-smoker, no prior respiratory history.    Past Medical History:  Diagnosis Date   Allergy    Bursitis    Bursitis    right wrist   Cataract    removed by surgery - bilateral   Hypercholesteremia    Hypertension     Patient Active Problem List   Diagnosis Date Noted   Coronary artery disease involving native coronary artery of native heart without angina pectoris 07/08/2018   Essential hypertension 07/08/2018   Pure hypercholesterolemia 07/08/2018    Past Surgical History:  Procedure Laterality Date   ANAL FISTULOTOMY     CARDIAC CATHETERIZATION     CATARACT EXTRACTION Bilateral    EXPLORATORY LAPAROTOMY     EYE SURGERY Left    floaters - laser   wisdom teeth ext      OB History   No obstetric history on file.      Home Medications    Prior to Admission medications  Medication Sig Start Date End Date Taking? Authorizing Provider  albuterol (VENTOLIN HFA) 108 (90 Base) MCG/ACT inhaler Inhale 2 puffs into the lungs. 01/07/24 01/06/25  [provider]  atorvastatin  (LIPITOR) 80 MG tablet Take 1 tablet by mouth once daily 06/28/22   Miriam Norris, NP  chlorthalidone (HYGROTON) 25 MG tablet Take 12.5 mg by mouth daily.    [provider]   diclofenac  Sodium (VOLTAREN ) 1 % GEL Apply 2 g topically 4 (four) times daily. Patient not taking: Reported on 11/30/2023 07/10/23   Teresa Shelba SAUNDERS, NP  diltiazem (CARDIZEM CD) 180 MG 24 hr capsule Take 180 mg by mouth daily. 09/27/19   [provider]  furosemide (LASIX) 20 MG tablet Take 20 mg by mouth.    [provider]  lisinopril (ZESTRIL) 20 MG tablet Take 20 mg by mouth daily. 08/20/19   [provider]  loratadine (CLARITIN) 10 MG tablet Take 10 mg by mouth daily.    [provider]  metaxalone  (SKELAXIN ) 800 MG tablet Take 1 tablet (800 mg total) by mouth 3 (three) times daily. 08/05/23   Corlis Burnard DEL, NP  nebivolol (BYSTOLIC) 10 MG tablet Take 10 mg by mouth daily. 03/10/20   [provider]  nitroGLYCERIN (NITROSTAT) 0.4 MG SL tablet Place 0.4 mg under the tongue every 5 (five) minutes as needed for chest pain.    [provider]  Olopatadine  HCl 0.2 % SOLN Apply 1 drop to eye daily. Patient not taking: Reported on 11/30/2023 01/09/23   Teresa Shelba SAUNDERS, NP  potassium chloride SA (KLOR-CON M) 20 MEQ tablet Take 20 mEq by mouth daily. Patient not taking: Reported on 11/30/2023 06/18/22   [provider]  topiramate (TOPAMAX) 50 MG tablet Take 50 mg  by mouth daily.    [provider]  UNABLE TO FIND Allergy injections- 2 injection q weekly given by [facility]    [provider]  Vitamin D, Ergocalciferol, (DRISDOL) 50000 units CAPS capsule Take 1 capsule by mouth once a week. 05/07/17   [provider]    Family History Family History  Problem Relation Age of Onset   Heart disease Mother    Hypertension Mother    Heart disease Father    Hypertension Father    Diabetes Father    Heart disease Sister    Hypertension Sister    Diabetes Sister    Heart attack Maternal Grandfather    Colon cancer Neg Hx    Breast cancer Neg Hx    Mental illness Neg Hx    Rectal cancer Neg Hx    Stomach  cancer Neg Hx     Social History Social History[1]   Allergies   Levofloxacin, Sulfur dioxide, Aspirin, Biaxin [clarithromycin], Penicillins, Ceftin [cefuroxime axetil], Cortisone, Cortizone-10 [hydrocortisone], Prednisone, and Sulfa antibiotics   Review of Systems Review of Systems  Constitutional:  Positive for fever. Negative for activity change, appetite change, chills, diaphoresis, fatigue and unexpected weight change.  HENT:  Positive for congestion, postnasal drip, sinus pressure and sinus pain. Negative for dental problem, drooling, ear discharge, ear pain, facial swelling, hearing loss, mouth sores, nosebleeds, rhinorrhea, sneezing, sore throat, tinnitus, trouble swallowing and voice change.   Respiratory:  Positive for cough and shortness of breath. Negative for apnea, choking, chest tightness, wheezing and stridor.   Gastrointestinal: Negative.   Neurological:  Positive for headaches. Negative for dizziness, tremors, seizures, syncope, facial asymmetry, speech difficulty, weakness, light-headedness and numbness.     Physical Exam Triage Vital Signs ED Triage Vitals  Encounter Vitals Group     BP 02/23/24 1255 114/73     Girls Systolic BP Percentile --      Girls Diastolic BP Percentile --      Boys Systolic BP Percentile --      Boys Diastolic BP Percentile --      Pulse Rate 02/23/24 1255 60     Resp 02/23/24 1255 18     Temp 02/23/24 1255 98.4 F (36.9 C)     Temp Source 02/23/24 1255 Oral     SpO2 02/23/24 1255 95 %     Weight --      Height --      Head Circumference --      Peak Flow --      Pain Score 02/23/24 1258 5     Pain Loc --      Pain Education --      Exclude from Growth Chart --    No data found.  Updated Vital Signs BP 114/73 (BP Location: Right Arm)   Pulse 60   Temp 98.4 F (36.9 C) (Oral)   Resp 18   LMP  (LMP Unknown)   SpO2 95%   Visual Acuity Right Eye Distance:   Left Eye Distance:   Bilateral Distance:    Right Eye  Near:   Left Eye Near:    Bilateral Near:     Physical Exam Constitutional:      Appearance: Normal appearance.  HENT:     Head: Normocephalic.     Right Ear: Tympanic membrane, ear canal and external ear normal.     Left Ear: Tympanic membrane, ear canal and external ear normal.     Nose: Congestion present.  Mouth/Throat:     Pharynx: No oropharyngeal exudate or posterior oropharyngeal erythema.  Eyes:     Extraocular Movements: Extraocular movements intact.  Cardiovascular:     Rate and Rhythm: Normal rate and regular rhythm.     Pulses: Normal pulses.     Heart sounds: Normal heart sounds.  Pulmonary:     Effort: Pulmonary effort is normal.     Breath sounds: Wheezing present.  Musculoskeletal:     Cervical back: Normal range of motion and neck supple.  Neurological:     Mental Status: She is alert and oriented to person, place, and time. Mental status is at baseline.      UC Treatments / Results  Labs (all labs ordered are listed, but only abnormal results are displayed) Labs Reviewed  POCT INFLUENZA A/B    EKG   Radiology No results found.  Procedures Procedures (including critical care time)  Medications Ordered in UC Medications - No data to display  Initial Impression / Assessment and Plan / UC Course  I have reviewed the triage vital signs and the nursing notes.  Pertinent labs & imaging results that were available during my care of the patient were reviewed by me and considered in my medical decision making (see chart for details).  Acute URI  Vital signs stable, patient in no signs of distress nontoxic-appearing, wheezing heard throughout all lobes, O2 saturation 95% on room air,, stable for outpatient management, methylprednisolone  IM given only able to tolerate injected steroids therefore deferring use of oral medication, recommended continued use of albuterol inhaler and extending course of doxycycline  for 7 days, recommend over-the-counter  medications and nonpharmacological supportive care, given strict precautions for worsening as shortness of breath or wheezing and advised follow-up with urgent care if symptoms persist Final Clinical Impressions(s) / UC Diagnoses   Final diagnoses:  Acute URI   Discharge Instructions   None    ED Prescriptions   None    PDMP not reviewed this encounter.     [1]  Social History Tobacco Use   Smoking status: Never   Smokeless tobacco: Never  Vaping Use   Vaping status: Never Used  Substance Use Topics   Alcohol use: No   Drug use: No     Teresa Shelba SAUNDERS, NP 02/23/24 1402  "

## 2024-02-23 NOTE — Discharge Instructions (Addendum)
 On exam able to hear wheezing within your lungs which is a sign of airway tightness however you are getting enough air without assistance  We will do an additional course of antibiotics, take doxycycline  twice daily for 7 days  You have been given a injection of steroids today to open and relax your airway in an effort to reduce wheezing making it easier for you to breathe  You may use cough syrup at bedtime to allow for rest    You can take Tylenol  and/or Ibuprofen as needed for fever reduction and pain relief.   For cough: honey 1/2 to 1 teaspoon (you can dilute the honey in water or another fluid).  You can also use guaifenesin and dextromethorphan for cough. You can use a humidifier for chest congestion and cough.  If you don't have a humidifier, you can sit in the bathroom with the hot shower running.      For sore throat: try warm salt water gargles, cepacol lozenges, throat spray, warm tea or water with lemon/honey, popsicles or ice, or OTC cold relief medicine for throat discomfort.   For congestion: take a daily anti-histamine like Zyrtec, Claritin, and a oral decongestant, such as pseudoephedrine.  You can also use Flonase  1-2 sprays in each nostril daily.   It is important to stay hydrated: drink plenty of fluids (water, gatorade/powerade/pedialyte, juices, or teas) to keep your throat moisturized and help further relieve irritation/discomfort.

## 2024-03-02 ENCOUNTER — Ambulatory Visit: Attending: Cardiology | Admitting: Cardiology

## 2024-03-02 VITALS — BP 106/68 | HR 54 | Ht 64.0 in | Wt 168.0 lb

## 2024-03-02 DIAGNOSIS — I10 Essential (primary) hypertension: Secondary | ICD-10-CM

## 2024-03-02 DIAGNOSIS — R0602 Shortness of breath: Secondary | ICD-10-CM | POA: Diagnosis not present

## 2024-03-02 DIAGNOSIS — I2510 Atherosclerotic heart disease of native coronary artery without angina pectoris: Secondary | ICD-10-CM | POA: Diagnosis not present

## 2024-03-02 DIAGNOSIS — E7800 Pure hypercholesterolemia, unspecified: Secondary | ICD-10-CM

## 2024-03-02 NOTE — Patient Instructions (Signed)
 Medication Instructions:  The current medical regimen is effective;  continue present plan and medications.  *If you need a refill on your cardiac medications before your next appointment, please call your pharmacy*   Follow-Up: At [facility], you and your health needs are our priority.  As part of our continuing mission to provide you with exceptional heart care, our providers are all part of one team.  This team includes your primary Cardiologist (physician) and Advanced Practice Providers or APPs (Physician Assistants and Nurse Practitioners) who all work together to provide you with the care you need, when you need it.  Your next appointment:   Follow up as needed   We recommend signing up for the patient portal called "MyChart".  Sign up information is provided on this After Visit Summary.  MyChart is used to connect with patients for Virtual Visits (Telemedicine).  Patients are able to view lab/test results, encounter notes, upcoming appointments, etc.  Non-urgent messages can be sent to your provider as well.   To learn more about what you can do with MyChart, go to [email or web address].

## 2024-03-02 NOTE — Progress Notes (Signed)
 " Cardiology Office Note:  .   Date:  03/02/2024  ID:  Suzanne Donaldson, DOB Jul 04, 1960, MRN [id number] PCP: Katrine Arland Browning, MD  [facility] Providers Cardiologist:  Oneil Parchment, MD     History of Present Illness: .   Suzanne Donaldson is a 64 y.o. female Discussed the use of AI scribe   History of Present Illness Suzanne Donaldson is a 64 year old female with hypertension and hyperlipidemia who presents with concerns about a respiratory infection and a lung nodule.  She has been experiencing persistent respiratory issues since a fall in the summer. She experienced two broken ribs prior to development of pneumonia. Despite completing a course of antibiotics for a respiratory infection that developed following the rib fractures, she continues to experience significant breathing difficulties. Recently, she visited urgent care due to worsening breathing and received a prednisone shot, which significantly improved her symptoms, including a drastic reduction in coughing. She was also prescribed cough medicine, which she had not been offered before. There is significant improvement in breathing and reduction in coughing after the prednisone shot.  A CT scan of her lungs revealed a 3 mm mass.  I am able to see that she had the CT scan in care everywhere but unable to view the report.  She expresses anxiety about the mass, as no further tests have been conducted.  Her past medical history includes hypertension, managed with chlorthalidone 12.5 mg daily, lisinopril 20 mg daily, furosemide 20 mg daily, spironolactone 25 mg daily, and nebivolol 10 mg daily. She also has hyperlipidemia, for which she takes atorvastatin  80 mg daily. She has a history of a heart catheterization in 2009 showing nonobstructive disease and a Myovu in 2019 showing no ischemia. Her LDL was previously recorded at 61. She has experienced statin intolerance in the past with Crestor, which caused significant discomfort.  Family  history is significant for heart disease in both parents in their late forties and a sister with a pacemaker.  She is actively involved with her six grandchildren, which impacts her schedule and availability for medical appointments.      Studies Reviewed: SABRA   EKG Interpretation Date/Time:  Tuesday March 02 2024 09:52:18 EST Ventricular Rate:  54 PR Interval:  164 QRS Duration:  100 QT Interval:  460 QTC Calculation: 436 R Axis:   17  Text Interpretation: Sinus bradycardia No previous ECGs available Confirmed by Parchment Oneil ([id number]) on 03/02/2024 10:05:41 AM    Results Labs LDL (12/2023): 77 Hemoglobin A1c: 5.6 Potassium: 3.8 Creatinine: 0.9  Diagnostic Heart catheterization (2009): Nonobstructive coronary artery disease; no percutaneous coronary intervention performed Myocardial perfusion imaging (2019): No ischemia Risk Assessment/Calculations:            Physical Exam:   VS:  BP 106/68 (BP Location: Left Arm, Patient Position: Sitting, Cuff Size: Normal)   Pulse (!) 54   Ht 5' 4 (1.626 m)   Wt 168 lb (76.2 kg)   LMP  (LMP Unknown)   SpO2 96%   BMI 28.84 kg/m    Wt Readings from Last 3 Encounters:  03/02/24 168 lb (76.2 kg)  07/10/23 162 lb (73.5 kg)  07/04/22 165 lb 9.6 oz (75.1 kg)    GEN: Well nourished, well developed in no acute distress NECK: No JVD; No carotid bruits CARDIAC: RRR, no murmurs, no rubs, no gallops RESPIRATORY:  Clear to auscultation without rales, wheezing or rhonchi  ABDOMEN: Soft, non-tender, non-distended EXTREMITIES:  No edema; No deformity   ASSESSMENT  AND PLAN: .    Assessment and Plan Assessment & Plan Pulmonary nodule A 3 mm pulmonary nodule was identified on a recent CT scan. It is considered an incidental finding and not concerning due to its small size. No further imaging is recommended at this time. - Discuss pulmonary nodule with new primary care provider when established to make sure that this was truly the finding  that was noted on report.  Essential hypertension Hypertension is well-controlled with the current medication regimen, including chlorthalidone, lisinopril, furosemide, spironolactone, and nebivolol. Blood pressure is stable. - Continue current antihypertensive regimen.  Hyperlipidemia Managed with atorvastatin  80 mg daily. LDL was 77 in November 2025, slightly above the target but improved from previous levels. Statin intolerance with Crestor noted, but atorvastatin  is well-tolerated. - Continue atorvastatin  80 mg daily.  Hypokalemia Previously noted, but potassium levels are currently stable at 3.8. No acute issues related to potassium levels. - Continue monitoring potassium levels.         Dispo: We will follow-up as needed.  Please let us  know if we can be of further assistance.  I am glad that she is feeling better.  Signed, Oneil Parchment, MD  "

## 2024-03-10 ENCOUNTER — Ambulatory Visit: Admitting: Cardiology
